# Patient Record
Sex: Female | Born: 1945 | Race: White | Hispanic: No | Marital: Married | Smoking: Never smoker
Health system: Southern US, Community
[De-identification: ages and names within clinical notes are randomized; demographics above are authoritative.]

## PROBLEM LIST (undated history)

## (undated) DIAGNOSIS — M419 Scoliosis, unspecified: Secondary | ICD-10-CM

## (undated) DIAGNOSIS — F419 Anxiety disorder, unspecified: Secondary | ICD-10-CM

## (undated) DIAGNOSIS — M961 Postlaminectomy syndrome, not elsewhere classified: Secondary | ICD-10-CM

## (undated) DIAGNOSIS — I10 Essential (primary) hypertension: Secondary | ICD-10-CM

## (undated) DIAGNOSIS — E785 Hyperlipidemia, unspecified: Secondary | ICD-10-CM

## (undated) DIAGNOSIS — Z9289 Personal history of other medical treatment: Secondary | ICD-10-CM

## (undated) HISTORY — DX: Hyperlipidemia, unspecified: E78.5

## (undated) HISTORY — DX: Scoliosis, unspecified: M41.9

## (undated) HISTORY — PX: COLONOSCOPY: SHX174

## (undated) HISTORY — DX: Postlaminectomy syndrome, not elsewhere classified: M96.1

## (undated) HISTORY — DX: Anxiety disorder, unspecified: F41.9

## (undated) HISTORY — DX: Essential (primary) hypertension: I10

## (undated) HISTORY — DX: Personal history of other medical treatment: Z92.89

---

## 1997-07-14 ENCOUNTER — Other Ambulatory Visit: Admission: RE | Admit: 1997-07-14 | Discharge: 1997-07-14 | Payer: Self-pay | Admitting: Obstetrics and Gynecology

## 1998-04-08 ENCOUNTER — Other Ambulatory Visit: Admission: RE | Admit: 1998-04-08 | Discharge: 1998-04-08 | Payer: Self-pay | Admitting: Obstetrics and Gynecology

## 1998-05-18 ENCOUNTER — Encounter: Payer: Self-pay | Admitting: Obstetrics and Gynecology

## 1998-05-18 ENCOUNTER — Ambulatory Visit (HOSPITAL_COMMUNITY): Admission: RE | Admit: 1998-05-18 | Discharge: 1998-05-18 | Payer: Self-pay | Admitting: Obstetrics and Gynecology

## 1999-07-12 ENCOUNTER — Other Ambulatory Visit: Admission: RE | Admit: 1999-07-12 | Discharge: 1999-07-12 | Payer: Self-pay | Admitting: Obstetrics and Gynecology

## 2000-09-12 ENCOUNTER — Other Ambulatory Visit: Admission: RE | Admit: 2000-09-12 | Discharge: 2000-09-12 | Payer: Self-pay | Admitting: Obstetrics and Gynecology

## 2001-12-09 ENCOUNTER — Other Ambulatory Visit: Admission: RE | Admit: 2001-12-09 | Discharge: 2001-12-09 | Payer: Self-pay | Admitting: Obstetrics and Gynecology

## 2003-05-04 ENCOUNTER — Other Ambulatory Visit: Admission: RE | Admit: 2003-05-04 | Discharge: 2003-05-04 | Payer: Self-pay | Admitting: Obstetrics and Gynecology

## 2005-02-19 HISTORY — PX: BACK SURGERY: SHX140

## 2005-02-19 HISTORY — PX: DIAPHRAGMATIC HERNIA REPAIR: SUR1167

## 2005-10-30 ENCOUNTER — Other Ambulatory Visit: Admission: RE | Admit: 2005-10-30 | Discharge: 2005-10-30 | Payer: Self-pay | Admitting: Obstetrics and Gynecology

## 2005-11-12 ENCOUNTER — Inpatient Hospital Stay (HOSPITAL_COMMUNITY): Admission: RE | Admit: 2005-11-12 | Discharge: 2005-11-15 | Payer: Self-pay | Admitting: Neurosurgery

## 2006-07-10 ENCOUNTER — Encounter: Admission: RE | Admit: 2006-07-10 | Discharge: 2006-07-10 | Payer: Self-pay | Admitting: Neurosurgery

## 2006-09-02 ENCOUNTER — Inpatient Hospital Stay (HOSPITAL_COMMUNITY): Admission: RE | Admit: 2006-09-02 | Discharge: 2006-09-02 | Payer: Self-pay | Admitting: Neurosurgery

## 2006-11-27 ENCOUNTER — Ambulatory Visit: Payer: Self-pay | Admitting: Thoracic Surgery

## 2007-01-06 ENCOUNTER — Encounter (INDEPENDENT_AMBULATORY_CARE_PROVIDER_SITE_OTHER): Payer: Self-pay | Admitting: General Surgery

## 2007-01-06 ENCOUNTER — Ambulatory Visit: Payer: Self-pay | Admitting: Thoracic Surgery

## 2007-01-06 ENCOUNTER — Inpatient Hospital Stay (HOSPITAL_COMMUNITY): Admission: RE | Admit: 2007-01-06 | Discharge: 2007-01-09 | Payer: Self-pay | Admitting: Thoracic Surgery

## 2007-08-12 ENCOUNTER — Encounter: Admission: RE | Admit: 2007-08-12 | Discharge: 2007-08-12 | Payer: Self-pay | Admitting: Neurosurgery

## 2010-07-04 NOTE — Op Note (Signed)
Deborah Sellers, Deborah Sellers              ACCOUNT NO.:  [id number]   MEDICAL RECORD NO.:  [id number]          PATIENT TYPE:  INP   LOCATION:  2550                         FACILITY:  [facility]   PHYSICIAN:  Adolph Pollack, M.D.DATE OF BIRTH:  01-06-1946   DATE OF PROCEDURE:  01/06/2007  DATE OF DISCHARGE:                               OPERATIVE REPORT   PREOPERATIVE DIAGNOSIS:  Right diaphragmatic hernia (of).   POSTOPERATIVE DIAGNOSIS:  Right diaphragmatic hernia (of ).   PROCEDURE:  Laparoscopic repair of diaphragmatic hernia with PROCEED  mesh.   SURGEON:  Adolph Pollack, M.D.   ASSISTANT:  Ines Bloomer, M.D.   ANESTHESIA:  General.   INDICATIONS:  Mrs. Strahan is a 65 year old female who underwent a spinal  fusion and she had right cardiophrenic mass noted.  She underwent a  second operation approximately 9-10 months later and the preop chest x-  ray demonstrated increased size of this mass.  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  She was seen by Dr. Edwyna Shell as most of these in the past have  been repaired by a thoracic approach, although recently some had been  repaired laparoscopically.  She now presents for laparoscopic, possible  thoracoscopic-assisted hernia repair.  The procedure and the risks were  discussed with her preoperatively.   TECHNIQUE:  She is seen in the holding area and brought to the operating  room, placed supine on the operating table and general anesthetic was  administered.  She was then placed in the at 45-degree position with  areas padded appropriately.  A double lumen tube had been used for  intubation.  The right chest, anterior and posterior,  and abdominal  wall and right flank was sterilely prepped and draped.   She was turned to the supine position and a small subumbilical incision  was made through the  skin, subcutaneous tissue, fascia and peritoneum  entering the peritoneal cavity under direct vision.  A pursestring  suture of 0 Vicryl was placed around the fascial edges.  A Hassan trocar  was introduced into the peritoneal cavity and pneumoperitoneum created  by insufflation of CO2 gas.   Next, a laparoscope was introduced and the hernia contents and the  omentum going up into the right chest to the diaphragmatic defect was  noted.  A 5 mm trocar was then placed in the left lateral, mid lateral  abdomen and one in the right mid lateral abdomen.  Using careful manual  traction,  the hernia was reduced of the contents of the right part of  the colon and the omentum appeared to be around the hepatic flexure  where the colon was reduced.  It was adherent to the sac and this  adherent area was divided with the Harmonic scalpel, completely reducing  the hernia contents back into the abdominal cavity.  No injury to the  colon was noted.   Following this, I excised the sac using harmonic scalpel until we could  see the  central tendon of the diaphragm well.  We were then able to  measure the defect and measured approximately 8 cm wide x 4 to 5 cm in  the longitudinal fashion.  A piece of PROCEED mesh was then brought into  the field and cut.  I was I was able to the spinal needles to give me  three anchoring sutures in the 12 o'clock, 3 o'clock and 9 o'clock  positions.  The mesh was cut to an appropriate size to allow for 3-4 cm  of overlap in all directions.  Anchoring sutures of  #1 Novofil were  then put in the 12 o'clock, 3 o'clock and 9 o'clock positions.  The mesh  was then placed into the abdominal cavity with the blue side facing up.  Stab incisions were performed in the 12 o'clock, 3 o'clock and 9 o'clock  positions around the defect and then the sutures were pulled up and tied  down across the fascial bridge, initially anchoring up the superior  aspect of mesh.  I then used spiral  tackers to further anchor the  superior aspect of the mesh and then also anchored the mesh were  circumferentially and with an outer rim and inner rim.  This provided  more than adequate coverage with a probe with adequate overlap of the  defect.  I then inspected the repair and no bleeding was noted.  The  viscera were again expect inspected and no injury or bleeding was noted.  There was minimal blood loss.   Following this,  I released the pneumoperitoneum and watched the liver  and part of the omentum approximate the mesh.  The trocars were then  removed.  The subumbilical fascial defect was closed by tightening up  and tying down the pursestring suture.   Skin incisions were closed with 4-0 Monocryl subcuticular stitches  followed by Steri-Strips and sterile dressings.  She tolerated the  procedure without apparent complications and was taken to recovery in  satisfactory condition.      Adolph Pollack, M.D.  Electronically Signed     TJR/MEDQ  D:  01/06/2007  T:  01/06/2007  Job:  [id number]   cc:   Ines Bloomer, M.D.  Barry Dienes Eloise Harman, M.D.

## 2010-07-04 NOTE — Op Note (Signed)
NAME:  Deborah Sellers, Deborah Sellers              ACCOUNT NO.:  [id number]   MEDICAL RECORD NO.:  [id number]          PATIENT TYPE:  INP   LOCATION:  NA                           FACILITY:  [facility]   PHYSICIAN:  Henry A. Pool, M.D.    DATE OF BIRTH:  Sep 09, 1945   DATE OF PROCEDURE:  09/02/2006  DATE OF DISCHARGE:                               OPERATIVE REPORT   PREOPERATIVE DIAGNOSIS:  Prominent right-sided T12 and L1 hardware  status post thoracolumbar decompression and fusion with instrumentation.   POSTOPERATIVE DIAGNOSIS:  Prominent right-sided T12 and L1 hardware  status post thoracolumbar decompression and fusion with instrumentation.   PROCEDURE NOTE:  Re-exploration of fusion with removal of T12 and L1  hardware on the right.   SURGEON:  Kathaleen Maser. Pool, M.D.   ASSISTANT:  Donalee Citrin, M.D.   ANESTHESIA:  General orotracheal anesthesia.   INDICATIONS FOR PROCEDURE:  [name] is a 65 year old female, status  post previous thoracolumbar decompression and fusion with  instrumentation for treatment of her idiopathic scoliosis.  Postoperatively the patient has done well with great improvement of her  overall pain syndrome, however, she has been bothered by some prominent  hardware on the right side at the superior aspect of her construct.  This caused her discomfort while sitting in a chair.  Follow-up studies  revealed stable appearance of the fusion with what appears to be intact  bony union throughout.  I discussed situation with the patient.  We have  decided to remove the upper aspect of the construct on the right side in  hopes of improving her pain level.  Patient is aware of the risks and  benefits involved with surgery and wishes to proceed.   DESCRIPTION OF PROCEDURE:  Patient taken to the operating room and  placed on the table in supine position.  After adequate level of  anesthesia was achieved, the patient positioned prone onto Wilson frame,  appropriately padded the patient  lumbar region and lower thoracic region  prepped draped sterilely.  A 10-blade was used to make a linear skin  incision.  This was carried down sharply in the midline.  The pedicle  screw instrumentation on the right-side at T12 and L1 were dissected  free.  The transverse connector between T12 and L1 was dissected free  and then removed.  The screw heads on the right at T12 and L1 were then  dissected free and the locking caps were removed.  The rod was then cut  with a titanium cutting drill bit.  The rod section from above L2 was  then removed.  Pedicle screws at L1 and T12 were then removed.  The  fusion was explored and found to be solid from T12 distally.  Wound was  then copiously irrigated with antibiotic solution.  Hemostasis was  achieved with a Bovie electrocautery.  Wound was then closed in layers with Vicryl sutures.  Steri-Strips and  sterile dressing were applied.  There were no apparent complications.  The patient tolerated the procedure well and she returns to the recovery  room postoperatively.  ______________________________  Kathaleen Maser Pool, M.D.     HAP/MEDQ  D:  09/02/2006  T:  09/02/2006  Job:  [id number]

## 2010-07-04 NOTE — H&P (Signed)
Deborah Sellers, Deborah Sellers              ACCOUNT NO.:  [id number]   MEDICAL RECORD NO.:  [id number]          PATIENT TYPE:  INP   LOCATION:  NA                           FACILITY:  [facility]   PHYSICIAN:  Ines Bloomer, M.D. DATE OF BIRTH:  [birth year]   DATE OF ADMISSION:  DATE OF DISCHARGE:                              HISTORY & PHYSICAL   CHIEF COMPLAINT:  Diaphragmatic hernia.   HISTORY OF PRESENT ILLNESS:  This 65 year old patient had surgery for  scoliosis recently with a spinal fusion September of 2007.  At that  time, a chest x-ray showed a small right cardiophrenic mass that was  thought to be a fat pad.  She had hardware removal in July of 2008 and  chest x-ray showed increased size of the right cardiophrenic mass.  CT  scan showed an anterior right-sided diaphragmatic defect with colon and  omentum indicative of a foramen of Morgagni hernia.  She has had no  dysphagia, bloating, abdominal pain or dyspnea.  She has some fatigue  and no constipation.  No history of bleeding.  She is referred here for  evaluation.   PAST MEDICAL HISTORY:  Significant for:  1. Hypertension.  2. Spinal scoliosis.   PAST SURGICAL HISTORY:  Spinal fusion and hardware removal.   ALLERGIES:  SHE HAS NO ALLERGIES.   MEDICATIONS:  She is on Diovan/hydrochlorothiazide 160 mg/12.5 a day.   SOCIAL HISTORY:  She is married and works as a Oncologist.  She does not drink alcohol.  She drinks 2 to 3  glasses of wine per week.   FAMILY HISTORY:  Noncontributory.   REVIEW OF SYSTEMS:  She is 125 pounds, she is 5 feet 2 inches.  Weight  has been stable.  CARDIAC:  No anterior defibrillation.  PULMONARY:  See  history of present illness.  No hemoptysis, bronchitis or asthma.  GI:  No GERD.  No nausea, vomiting, constipation or diarrhea.  GU:  No kidney  disease, dysuria or frequent urination.  VASCULAR:  No claudication, DVT  or TIAs.  NEUROLOGICAL:  No headaches, blackouts or  seizures.  MUSCULOSKELETAL:  No arthritis or joint pain.  PSYCHIATRIC:  No  psychiatric.  HEENT:  No change in her eyesight or hearing.  HEMATOLOGICAL:  No problems bleeding or clotting disorders.   PHYSICAL EXAMINATION:  VITAL SIGNS:  Her blood pressure is 180/100.  Pulse 90.  Respirations 18.  Saturations are 98%.  HEENT:  Head is atraumatic.  Eyes:  Pupils are equal and reactive to  light and accommodation.  Extraocular movements are normal.  Ears:  Tympanic membranes are intact.  Nose:  There is no septal deviation.  Throat is without lesion.  NECK:  Supple without thyromegaly.  There is no supraclavicular or  axillary adenopathy.  CHEST:  Clear to auscultation and percussion.  HEART:  Regular sinus rhythm.  No murmurs.  ABDOMEN:  Soft.  There is no abdominal tenderness.  No  hepatosplenomegaly.  Bowel sounds are normal.  EXTREMITIES:  Pulses are 2+.  There is no clubbing or edema.  BACK:  There is  spinal incisions.  NEUROLOGICAL:  She is oriented x3.  Sensory and motor intact.  Cranial  nerves intact.  SKIN:  Without lesion.   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  2. Scoliosis, status post surgery.  3. Hypertension.   PLAN:  Repair of foramen of Morgagni hernia via the abdominal and  possibly chest approach with Dr. Avel Peace.      Ines Bloomer, M.D.  Electronically Signed     DPB/MEDQ  D:  01/02/2007  T:  01/02/2007  Job:  [id number]

## 2010-07-07 NOTE — Letter (Signed)
November 29, 2006   Adolph Pollack, M.D.  [address], [address]  [place], [place] [address]   Re:  Deborah, Sellers                DOB:  08/09/1945   Dear Tawanna Cooler:   I appreciate the opportunity seeing Deborah Sellers.  This 65 year old  patient apparently had back surgery for scoliosis, and at the time of  her surgery, she had scoliosis with spinal fusion, which was done on  November 12, 2005.  A chest x-ray at that time showed a small right  cardiophrenic structure that was thought to be a fat pad.  She returned  for hardware removal in July 2008, and chest x-ray showed increased size  in the right cardiophrenic mass.  A CT scan was done at [facility],  which revealed an anterior right-sided diaphragmatic defect with colon  and omentum that had increased in size.  The anterior location goes  along with her right foramen Morgagni hernia.  She has had no dysphagia,  bloating, abdominal pain.  No dyspnea.  Has some fatigue.  No problems  with constipation.   PAST MEDICAL HISTORY:  She has hypertension as well as the spinal  scoliosis.  The only operation was the spinal fusion with  instrumentation, and then removal of some of the hardware.   SHE HAS NO ALLERGIES.   MEDICATIONS:  Include Diovan hydrochlorothiazide 160 mg daily.   SOCIAL HISTORY:  She is married.  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  No history of tobacco abuse.  She has 2 to 3  glasses of wine per week.   FAMILY HISTORY:  Noncontributory.   REVIEW OF SYSTEMS:  Weight is 125 pounds.  She is 5 feet 2 inches.  Her  weight has been stable.  CARDIAC:  No angina or atrial fibrillation.  PULMONARY:  See history of present illness.  No bronchitis, hemoptysis  or asthma.  GI:  See history of present illness.  No history of GERD.  GU:  No kidney disease, dysuria, or frequent urination.  VASCULAR:  No claudication, DVT, TIA.  NEUROLOGIC:  No headaches, blackouts, or seizures.  MUSCULOSKELETAL:  No arthritis or joint pain.  PSYCHIATRIC:  No depression or nervousness.  HEENT:  No change in her eyesight or hearing.  HOMOLOGICAL:  No problems with bleeding or clotting disorders.   PHYSICAL EXAMINATION:  Her blood pressure was 181/100.  Pulse 89.  Respirations 18.  Saturation 98%.  Head, eyes, ears, nose, and throat  are unremarkable.  Neck is supple without thyromegaly.  Chest is clear  to auscultation and percussion.  Heart regular sinus rhythm.  Abdomen is  soft.  There is no hepatosplenomegaly.  Pulses are 2+.  No clubbing or  edema.  Back:  She has had spinal incisions.  Neurologic:  She is  oriented x3.  Sensory and motor intact.  Cranial nerves are intact.   I feel that she has a large foramen Morgagni hernia with increasing size  of her herniation of her right colon and omentum.  She needs a repair of  the lesion from a thoracic approach.  The only problem would be is  whether we can reduce this easily from the chest, and may require  abdominal approach in order to be sure that the colon is adequately  reduced.  We will try to do this with a VATS approach in order to  minimize the chest spreading, rib  spreading, and incisions, but she will  probably need a Gortex patch to repair the defect.  I will discuss this  with you, as we may need to do a combined approach in order to make sure  that the colon is adequately reduced.   I appreciate the opportunity in seeing this very interesting patient.   Ines Bloomer, M.D.  Electronically Signed   DPB/MEDQ  D:  11/29/2006  T:  11/30/2006  Job:  [id number]   cc:   Barry Dienes. Eloise Harman, M.D.

## 2010-07-07 NOTE — Discharge Summary (Signed)
Deborah Sellers, Deborah Sellers              ACCOUNT NO.:  [id number]   MEDICAL RECORD NO.:  [id number]          PATIENT TYPE:  INP   LOCATION:  5727                         FACILITY:  [facility]   PHYSICIAN:  Adolph Pollack, M.D.DATE OF BIRTH:  [birth year]   DATE OF ADMISSION:  01/06/2007  DATE OF DISCHARGE:  01/09/2007                               DISCHARGE SUMMARY   FINAL DISCHARGE DIAGNOSIS:  Diaphragmatic hernia (morgagni type).   SECONDARY DIAGNOSIS:  1. Hypertension.  2. Spinal scoliosis.  3. Postoperative atelectasis.   PROCEDURES:  Laparoscopic repair of diaphragmatic hernia with mesh.   REASON FOR ADMISSION:  [name] is a 65 year old female, incidentally  noted to have a right cardiophrenic mass that increased in size.  A CT  scan demonstrated a right-sided diaphragmatic defect with colon and  omentum up in it, and she was admitted.   HOSPITAL COURSE:  She was admitted and the plan was initially for a  laparoscopic, possible thoracoscopic repair.  However, by way of  laparoscopy, the repair was able to be performed.  Postoperatively, she  was in the step-down unit and slowly improved.  She had some significant  atelectasis which led to fever, as well as some desaturation; but with  oxygen and increasing her oximetry, this resolved.  Her diet was able to  be advanced.  Her chest x-ray on November 20th showed improved aeration  with some residual hernia sac.  It was felt that she was improved enough  to be able to be discharged.   DISPOSITION:  Discharged to home on January 09, 2007.  She was given  specific discharge instructions including activity restrictions.  She is  told to maintain a soft diet.  She will follow up with me in 2 or 3  weeks.      Adolph Pollack, M.D.  Electronically Signed     TJR/MEDQ  D:  02/09/2007  T:  02/10/2007  Job:  [id number]   cc:   Ines Bloomer, M.D.  Barry Dienes Eloise Harman, M.D.  Altamease Oiler, MD

## 2010-07-07 NOTE — Discharge Summary (Signed)
NAME:  ZOELLE, MARKUS              ACCOUNT NO.:  [id number]   MEDICAL RECORD NO.:  [id number]          PATIENT TYPE:  INP   LOCATION:  3010                         FACILITY:  [facility]   PHYSICIAN:  Sherilyn Cooter A. Pool, M.D.    DATE OF BIRTH:  [birth year]   DATE OF ADMISSION:  11/12/2005  DATE OF DISCHARGE:  11/15/2005                                 DISCHARGE SUMMARY   SERVICE:  Neurosurgery.   FINAL DIAGNOSIS:  Idiopathic thoracolumbar scoliosis with intractable pain.   OPERATION AND TREATMENTS:  T12-L5 decompressive laminectomy with fusion and  instrumentation.   HOSPITAL COURSE:  Ms. Donlon was admitted for multilevel thoracolumbar  decompression and fusion with instrumentation. Post surgery was uneventful.  Postoperatively the patient awakened with the normal amount of incisional  pain but no radicular deficits.  She was gradually mobilized with physical  and occupational therapy with good results.  She had some transient  hypotension which resolved.   CONDITION ON DISCHARGE:  Improving.   DISCHARGE DISPOSITION:  The patient will follow up in 1 week in my office.           ______________________________  Kathaleen Maser. Pool, M.D.     HAP/MEDQ  D:  12/12/2005  T:  12/13/2005  Job:  [id number]

## 2010-07-07 NOTE — Op Note (Signed)
Deborah Sellers, Deborah Sellers              ACCOUNT NO.:  [id number]   MEDICAL RECORD NO.:  [id number]          PATIENT TYPE:  INP   LOCATION:  2899                         FACILITY:  [facility]   PHYSICIAN:  Sherilyn Cooter A. Pool, M.D.    DATE OF BIRTH:  26-Apr-1945   DATE OF PROCEDURE:  11/12/2005  DATE OF DISCHARGE:                                 OPERATIVE REPORT   PREOPERATIVE DIAGNOSES:  1. Idiopathic thoracolumbar scoliosis with progressive disk degeneration      and decompensation.  2.  Multilevel stenosis.   POSTOPERATIVE DIAGNOSES:  1. Idiopathic thoracolumbar scoliosis with progressive disk degeneration      and decompensation.  2.  Multilevel stenosis.   PROCEDURE NAME:  1. T12 through L5 decompressive laminectomy with foraminotomies.      Decompression was more than would be required for simple interbody      fusion alone.  2. L1-2, L2-3, L3-4, L4-5 posterior lumbar interbody fusion utilizing      Tangent interbody allograft wedges and Telamoninterbody Peek cage with      local autografting.  3. T12 through L5 posterolateral arthrodesis utilizing segmental pedicle      screw fixation and local autografting.   SURGEON:  Kathaleen Maser. Pool, M.D.   ASSISTANT:  Donalee Citrin, M.D.   ANESTHESIA:  General orotracheal.   INDICATIONS:  [name] is a 65 year old female with a history of severe  lumbar pain, failing all conservative management.  The patient also has some  radicular component, left greater than right.  Workup demonstrates evidence  of an idiopathic thoracolumbar curve predominantly involving her lumbar  spine with decompensation in her mid lumbar region with a severe convex  curve to her left.  The patient is miserable with her pain.  She essentially  has become nonfunctional because of it.  We have discussed options for  management including the possibility of undergoing thoracolumbar  decompression and fusion with instrumentation in hopes of improving her  symptoms.   OPERATIVE  NOTE:  The patient was taken to the operating room, placed on the  Wilson in supine position.  After adequate level of anesthesia was achieved,  the patient was positioned prone onto the Wilson frame, appropriately  padded. The patient's lumbar region was prepped and draped sterilely.  A  midline, 7-cm skin incision extending from T10 down to L5.  This was carried  down sharply in the midline.  Subperiosteal dissection was then performed  exposing the lamina and facet joints of T11, T12, L1, L2, L3, L4, and L5.  Deep self-retaining retractors were placed.  Intraoperative fluoroscopy was  used and levels were confirmed.  Decompressive laminectomies were then  performed using the high-speed drill and Kerrison rongeurs, and the Leksell  rongeur to completely remove the lamina of T12, L1, L2, L3,L4 and then the  superior aspect of L5.  Inferior facetectomies were performed at L1, L2, L3,  L4 bilaterally.  Superior facetectomy was performed at L2, L3, L4, and L5  bilaterally.  All bone was cleaned and used later as autograft.  Ligament  flavum was elevated and resected from deeper fascia  using Kerrison rongeurs.  After very aggressive foraminotomies were performed along the course of the  exiting L1, L2, L3, L4, and L5 nerve roots, attention then placed toward the  interbody fusion.   Starting first at L4-5, the epidural venous plexus was coagulated and cut.  The thecal sac and nerve roots were protected starting first on the  patient's left side.  Disk space was incised with the 15 blade, and  retracted . A wide disk space cleaning  was then achieved using pituitary  rongeurs, open-angle pituitary rongeurs and Epstein curettes.  The procedure  then repeated at L4-5 on the left side and then bilaterally at L3-4, L2-3,  and L1-2.  Returning to L4-5, the disk space was distracted up to 11 mm with  an 11-mm distractor left in the patient's left side.  The thecal sac and  nerve roots were protected  on the right side.  Disk space was then reamed  and then cut with 10-mm Tangent instruments.  Soft tissues were removed from  the interspace.  A 10 x 26-mm Tangent wedge was then impacted into place,  recessed approximately 2 mm from the posterior cortical margin.  The  distractor was removed from the patient's left side.  The thecal sac and  nerve roots were protected on the left side.  Disk space was once again  reamed and then cut with 10-mm Tangent instruments.  Soft tissues removed  from the interspace.  Disk space was further curettaged using bone curettes.  Morselized autograft was impacted in the interspace.  A 10 x 26-mm Telamon  cage was then impacted into place recessed for approximately 2 mm from the  posterior cortical margin at L4-5.  The procedure was then repeated in a  very similar fashion at L3-4.  At L2-3 and L1-L2, 8 x 22-mm implants were  used bilaterally as well as a local autografting.  The pedicles of T12, L1,  L2, L3, L4, L5 were then identified using superficial landmarks and  intraoperative fluoroscopy.  The superficial bone overlying the pedicles was  then removed using the high-speed drill.  Each pedicle was then probed using  the pedicle awl.  Each pedicle was then tapped using a screw tap.  Each  screw tap hole was probed and found to be solidly within bone.  Then 5.75 x  45-mm screws were placed bilaterally at T12, 5.75 x 40-mm screws placed on  the left at L1 and a 45 screw was placed on the right at L1.  A 5.75 x 40-mm  screw was placed bilaterally at L2 and L3 and L4.  A 6.75 x 35-mm screw was  placed bilaterally at L5.  The transverse processes were then decorticated  using the high-speed drill.  Morselized autograft was impacted  posterolaterally for later fusion.  Segment of titanium rod was then cut and  contoured and placed over the screw heads at T12 through L5.  Locking caps were placed over the screw heads.  Locking caps were then engaged with the   construct under compression.  Good reduction of the patient's deformity had  been achieved.  The transverse connectors were placed.  Gelfoam was placed  topically and hemostasis was found to be good.  A medium Hemovac drain was  left in the epidural space.  The wound was then closed in layers of Vicryl  sutures.  Steri-Strips and sterile dressing were applied.  There were no  apparent complications.  The patient tolerated the procedure well and  she  returns to the recovery room for postoperative care.           ______________________________  Kathaleen Maser. Pool, M.D.     HAP/MEDQ  D:  11/12/2005  T:  11/14/2005  Job:  [id number]

## 2010-11-28 LAB — COMPREHENSIVE METABOLIC PANEL
ALT: 23
AST: 29
Alkaline Phosphatase: 74
CO2: 23
Calcium: 9
GFR calc Af Amer: >60
GFR calc non Af Amer: >60
Potassium: 3.6

## 2010-11-28 LAB — TYPE AND SCREEN
Antibody Screen: POSITIVE
DAT, IgG: NEGATIVE

## 2010-11-28 LAB — BLOOD GAS, ARTERIAL
Acid-base deficit: 1
Bicarbonate: 23
TCO2: 24.1
pCO2 arterial: 36.9
pH, Arterial: 7.411 — ABNORMAL HIGH

## 2010-11-28 LAB — URINALYSIS, ROUTINE W REFLEX MICROSCOPIC
Bilirubin Urine: NEGATIVE
Nitrite: NEGATIVE
Specific Gravity, Urine: 1.014
Urobilinogen, UA: 0.2

## 2010-11-28 LAB — APTT: aPTT: 28

## 2010-11-28 LAB — POCT I-STAT 4, (NA,K, GLUC, HGB,HCT)
HCT: 41
Operator id: 181601

## 2010-11-28 LAB — CBC
HCT: 40.1
MCHC: 33.7
MCV: 94.5

## 2010-11-28 LAB — URINE MICROSCOPIC-ADD ON

## 2010-12-05 LAB — DIFFERENTIAL
Basophils Absolute: 0
Basophils Relative: 0
Lymphocytes Relative: 33
Lymphs Abs: 2.2
Monocytes Absolute: 0.6
Monocytes Relative: 8

## 2010-12-05 LAB — BASIC METABOLIC PANEL
BUN: 14
CO2: 28
Calcium: 9.7
Chloride: 99
Creatinine, Ser: 0.6
GFR calc Af Amer: >60
Glucose, Bld: 105 — ABNORMAL HIGH
Sodium: 137

## 2010-12-05 LAB — CBC
HCT: 36.6
MCV: 91.4
Platelets: 312
WBC: 6.7

## 2011-05-16 ENCOUNTER — Ambulatory Visit: Payer: Self-pay | Admitting: Obstetrics and Gynecology

## 2012-11-19 ENCOUNTER — Encounter: Payer: Self-pay | Admitting: Internal Medicine

## 2012-11-20 ENCOUNTER — Ambulatory Visit (AMBULATORY_SURGERY_CENTER): Payer: Self-pay | Admitting: *Deleted

## 2012-11-20 VITALS — Ht 60.0 in | Wt 135.0 lb

## 2012-11-20 DIAGNOSIS — Z1211 Encounter for screening for malignant neoplasm of colon: Secondary | ICD-10-CM

## 2012-11-20 MED ORDER — MOVIPREP 100 G PO SOLR: 1.0000 | Freq: Once | ORAL | Status: DC

## 2012-11-20 NOTE — Progress Notes (Signed)
Denies allergies to eggs or soy products. Denies complications with sedation or anesthesia. 

## 2012-11-21 ENCOUNTER — Encounter: Payer: Self-pay | Admitting: Internal Medicine

## 2012-11-25 ENCOUNTER — Ambulatory Visit (AMBULATORY_SURGERY_CENTER): Payer: BC Managed Care – PPO | Admitting: Internal Medicine

## 2012-11-25 ENCOUNTER — Encounter: Payer: Self-pay | Admitting: Internal Medicine

## 2012-11-25 VITALS — BP 144/84 | HR 64 | Temp 98.6°F | Resp 18 | Ht 60.0 in | Wt 135.0 lb

## 2012-11-25 DIAGNOSIS — Z1211 Encounter for screening for malignant neoplasm of colon: Secondary | ICD-10-CM

## 2012-11-25 MED ORDER — SODIUM CHLORIDE 0.9 % IV SOLN: 500.0000 mL | INTRAVENOUS | Status: DC

## 2012-11-25 NOTE — Progress Notes (Signed)
Patient did not have preoperative order for IV antibiotic SSI prophylaxis. (G8918)  Patient did not experience any of the following events: a burn prior to discharge; a fall within the facility; wrong site/side/patient/procedure/implant event; or a hospital transfer or hospital admission upon discharge from the facility. (G8907)  

## 2012-11-25 NOTE — Op Note (Signed)
[facility] [address] [place], [address]   COLONOSCOPY PROCEDURE REPORT  PATIENT: Deborah, Sellers  MR#: [id number] BIRTHDATE: September 02, 1945 , 66  yrs. old GENDER: Female ENDOSCOPIST: Beverley Fiedler, MD REFERRED NF:[id number] Eloise Harman, M.D. PROCEDURE DATE:  11/25/2012 PROCEDURE:   Colonoscopy, screening First Screening Colonoscopy - Avg.  risk and is 50 yrs.  old or older Yes.  Prior Negative Screening - Now for repeat screening. N/A  History of Adenoma - Now for follow-up colonoscopy & has been > or = to 3 yrs.  N/A  Polyps Removed Today? No.  Recommend repeat exam, <10 yrs? No. ASA CLASS:   Class II INDICATIONS:average risk screening and first colonoscopy. MEDICATIONS: MAC sedation, administered by CRNA, Propofol (Diprivan), and propofol (Diprivan) 350mg  IV  DESCRIPTION OF PROCEDURE:   After the risks benefits and alternatives of the procedure were thoroughly explained, informed consent was obtained.  A digital rectal exam revealed no rectal mass.   The LB PFC-H190 [id number]  endoscope was introduced through the anus and advanced to the cecum, which was identified by both the appendix and ileocecal valve. No adverse events experienced. The quality of the prep was good, using MoviPrep  The instrument was then slowly withdrawn as the colon was fully examined.   COLON FINDINGS: There was severe diverticulosis noted throughout the entire examined colon (left greater than right colon) with associated angulation and tortuosity.  Manual pressure and supine positioning were necessary to reach the cecum.  The colon mucosa was otherwise normal.  Retroflexed views revealed internal hemorrhoids. The time to cecum=15 minutes 07 seconds.  Withdrawal time=8 minutes 08 seconds.  The scope was withdrawn and the procedure completed. COMPLICATIONS: There were no complications.  ENDOSCOPIC IMPRESSION: 1.   There was severe diverticulosis noted throughout the entire examined  colon 2.   The colon mucosa was otherwise normal  RECOMMENDATIONS: 1.  High fiber diet 2.  You should continue to follow colorectal cancer screening guidelines for "routine risk" patients with a repeat colonoscopy in 10 years.  There is no need for FOBT (stool) testing for at least 5 years.   eSigned:  Beverley Fiedler, MD 11/25/2012 9:25 AM   cc: The Patient and Jarome Matin, MD

## 2012-11-25 NOTE — Progress Notes (Signed)
Report to pacu RN, vss, bbs=clear 

## 2012-11-25 NOTE — Patient Instructions (Addendum)
YOU HAD AN ENDOSCOPIC PROCEDURE TODAY AT THE [facility]: Refer to the procedure report that was given to you for any specific questions about what was found during the examination.  If the procedure report does not answer your questions, please call your gastroenterologist to clarify.  If you requested that your care partner not be given the details of your procedure findings, then the procedure report has been included in a sealed envelope for you to review at your convenience later.  YOU SHOULD EXPECT: Some feelings of bloating in the abdomen. Passage of more gas than usual.  Walking can help get rid of the air that was put into your GI tract during the procedure and reduce the bloating. If you had a lower endoscopy (such as a colonoscopy or flexible sigmoidoscopy) you may notice spotting of blood in your stool or on the toilet paper. If you underwent a bowel prep for your procedure, then you may not have a normal bowel movement for a few days.  DIET: Your first meal following the procedure should be a light meal and then it is ok to progress to your normal diet.  A half-sandwich or bowl of soup is an example of a good first meal.  Heavy or fried foods are harder to digest and may make you feel nauseous or bloated.  Likewise meals heavy in dairy and vegetables can cause extra gas to form and this can also increase the bloating.  Drink plenty of fluids but you should avoid alcoholic beverages for 24 hours.  ACTIVITY: Your care partner should take you home directly after the procedure.  You should plan to take it easy, moving slowly for the rest of the day.  You can resume normal activity the day after the procedure however you should NOT DRIVE or use heavy machinery for 24 hours (because of the sedation medicines used during the test).    SYMPTOMS TO REPORT IMMEDIATELY: A gastroenterologist can be reached at any hour.  During normal business hours, 8:30 AM to 5:00 PM Monday through Friday,  call [phone].  After hours and on weekends, please call the GI answering service at [phone] who will take a message and have the physician on call contact you.   Following lower endoscopy (colonoscopy or flexible sigmoidoscopy):  Excessive amounts of blood in the stool  Significant tenderness or worsening of abdominal pains  Swelling of the abdomen that is new, acute  Fever of 100F or higher  FOLLOW UP: If any biopsies were taken you will be contacted by phone or by letter within the next 1-3 weeks.  Call your gastroenterologist if you have not heard about the biopsies in 3 weeks.  Our staff will call the home number listed on your records the next business day following your procedure to check on you and address any questions or concerns that you may have at that time regarding the information given to you following your procedure. This is a courtesy call and so if there is no answer at the home number and we have not heard from you through the emergency physician on call, we will assume that you have returned to your regular daily activities without incident.  SIGNATURES/CONFIDENTIALITY: You and/or your care partner have signed paperwork which will be entered into your electronic medical record.  These signatures attest to the fact that that the information above on your After Visit Summary has been reviewed and is understood.  Full responsibility of the confidentiality of this   discharge information lies with you and/or your care-partner.  Recommendations See procedure report  

## 2012-11-26 ENCOUNTER — Telehealth: Payer: Self-pay | Admitting: *Deleted

## 2012-11-26 NOTE — Telephone Encounter (Signed)
Message left

## 2013-04-15 DIAGNOSIS — M899 Disorder of bone, unspecified: Secondary | ICD-10-CM | POA: Diagnosis not present

## 2013-04-15 DIAGNOSIS — M949 Disorder of cartilage, unspecified: Secondary | ICD-10-CM | POA: Diagnosis not present

## 2013-04-15 DIAGNOSIS — M4120 Other idiopathic scoliosis, site unspecified: Secondary | ICD-10-CM | POA: Diagnosis not present

## 2013-06-15 DIAGNOSIS — Z79899 Other long term (current) drug therapy: Secondary | ICD-10-CM | POA: Diagnosis not present

## 2013-06-15 DIAGNOSIS — Z5181 Encounter for therapeutic drug level monitoring: Secondary | ICD-10-CM | POA: Diagnosis not present

## 2013-06-23 DIAGNOSIS — M8448XA Pathological fracture, other site, initial encounter for fracture: Secondary | ICD-10-CM | POA: Diagnosis not present

## 2013-06-23 DIAGNOSIS — M4120 Other idiopathic scoliosis, site unspecified: Secondary | ICD-10-CM | POA: Diagnosis not present

## 2013-07-14 DIAGNOSIS — M961 Postlaminectomy syndrome, not elsewhere classified: Secondary | ICD-10-CM | POA: Diagnosis not present

## 2013-07-14 DIAGNOSIS — M545 Low back pain, unspecified: Secondary | ICD-10-CM | POA: Diagnosis not present

## 2013-07-14 DIAGNOSIS — M47817 Spondylosis without myelopathy or radiculopathy, lumbosacral region: Secondary | ICD-10-CM | POA: Diagnosis not present

## 2013-07-14 DIAGNOSIS — G894 Chronic pain syndrome: Secondary | ICD-10-CM | POA: Diagnosis not present

## 2013-07-23 DIAGNOSIS — G894 Chronic pain syndrome: Secondary | ICD-10-CM | POA: Diagnosis not present

## 2013-07-23 DIAGNOSIS — M545 Low back pain, unspecified: Secondary | ICD-10-CM | POA: Diagnosis not present

## 2013-07-23 DIAGNOSIS — M961 Postlaminectomy syndrome, not elsewhere classified: Secondary | ICD-10-CM | POA: Diagnosis not present

## 2013-07-23 DIAGNOSIS — M47817 Spondylosis without myelopathy or radiculopathy, lumbosacral region: Secondary | ICD-10-CM | POA: Diagnosis not present

## 2013-08-10 DIAGNOSIS — M545 Low back pain, unspecified: Secondary | ICD-10-CM | POA: Diagnosis not present

## 2013-08-10 DIAGNOSIS — M961 Postlaminectomy syndrome, not elsewhere classified: Secondary | ICD-10-CM | POA: Diagnosis not present

## 2013-08-10 DIAGNOSIS — M47817 Spondylosis without myelopathy or radiculopathy, lumbosacral region: Secondary | ICD-10-CM | POA: Diagnosis not present

## 2013-08-10 DIAGNOSIS — G894 Chronic pain syndrome: Secondary | ICD-10-CM | POA: Diagnosis not present

## 2013-08-12 DIAGNOSIS — M4120 Other idiopathic scoliosis, site unspecified: Secondary | ICD-10-CM | POA: Diagnosis not present

## 2013-08-12 DIAGNOSIS — Z6825 Body mass index (BMI) 25.0-25.9, adult: Secondary | ICD-10-CM | POA: Diagnosis not present

## 2013-08-25 DIAGNOSIS — M47817 Spondylosis without myelopathy or radiculopathy, lumbosacral region: Secondary | ICD-10-CM | POA: Diagnosis not present

## 2013-08-25 DIAGNOSIS — M961 Postlaminectomy syndrome, not elsewhere classified: Secondary | ICD-10-CM | POA: Diagnosis not present

## 2013-08-25 DIAGNOSIS — G894 Chronic pain syndrome: Secondary | ICD-10-CM | POA: Diagnosis not present

## 2013-08-25 DIAGNOSIS — M545 Low back pain, unspecified: Secondary | ICD-10-CM | POA: Diagnosis not present

## 2013-09-08 DIAGNOSIS — M545 Low back pain, unspecified: Secondary | ICD-10-CM | POA: Diagnosis not present

## 2013-09-08 DIAGNOSIS — Z124 Encounter for screening for malignant neoplasm of cervix: Secondary | ICD-10-CM | POA: Diagnosis not present

## 2013-09-08 DIAGNOSIS — G894 Chronic pain syndrome: Secondary | ICD-10-CM | POA: Diagnosis not present

## 2013-09-08 DIAGNOSIS — M961 Postlaminectomy syndrome, not elsewhere classified: Secondary | ICD-10-CM | POA: Diagnosis not present

## 2013-09-08 DIAGNOSIS — M47817 Spondylosis without myelopathy or radiculopathy, lumbosacral region: Secondary | ICD-10-CM | POA: Diagnosis not present

## 2013-11-09 DIAGNOSIS — M545 Low back pain, unspecified: Secondary | ICD-10-CM | POA: Diagnosis not present

## 2013-11-09 DIAGNOSIS — G894 Chronic pain syndrome: Secondary | ICD-10-CM | POA: Diagnosis not present

## 2013-11-09 DIAGNOSIS — M47817 Spondylosis without myelopathy or radiculopathy, lumbosacral region: Secondary | ICD-10-CM | POA: Diagnosis not present

## 2013-11-09 DIAGNOSIS — M961 Postlaminectomy syndrome, not elsewhere classified: Secondary | ICD-10-CM | POA: Diagnosis not present

## 2013-11-23 DIAGNOSIS — M961 Postlaminectomy syndrome, not elsewhere classified: Secondary | ICD-10-CM | POA: Diagnosis not present

## 2013-11-23 DIAGNOSIS — M47818 Spondylosis without myelopathy or radiculopathy, sacral and sacrococcygeal region: Secondary | ICD-10-CM | POA: Diagnosis not present

## 2013-11-23 DIAGNOSIS — M545 Low back pain: Secondary | ICD-10-CM | POA: Diagnosis not present

## 2013-11-23 DIAGNOSIS — G894 Chronic pain syndrome: Secondary | ICD-10-CM | POA: Diagnosis not present

## 2013-12-08 DIAGNOSIS — M47817 Spondylosis without myelopathy or radiculopathy, lumbosacral region: Secondary | ICD-10-CM | POA: Diagnosis not present

## 2013-12-08 DIAGNOSIS — Z1231 Encounter for screening mammogram for malignant neoplasm of breast: Secondary | ICD-10-CM | POA: Diagnosis not present

## 2013-12-08 DIAGNOSIS — G894 Chronic pain syndrome: Secondary | ICD-10-CM | POA: Diagnosis not present

## 2013-12-08 DIAGNOSIS — M545 Low back pain: Secondary | ICD-10-CM | POA: Diagnosis not present

## 2013-12-08 DIAGNOSIS — M961 Postlaminectomy syndrome, not elsewhere classified: Secondary | ICD-10-CM | POA: Diagnosis not present

## 2013-12-24 DIAGNOSIS — I10 Essential (primary) hypertension: Secondary | ICD-10-CM | POA: Diagnosis not present

## 2013-12-24 DIAGNOSIS — J069 Acute upper respiratory infection, unspecified: Secondary | ICD-10-CM | POA: Diagnosis not present

## 2013-12-24 DIAGNOSIS — H6990 Unspecified Eustachian tube disorder, unspecified ear: Secondary | ICD-10-CM | POA: Diagnosis not present

## 2014-01-05 DIAGNOSIS — M47817 Spondylosis without myelopathy or radiculopathy, lumbosacral region: Secondary | ICD-10-CM | POA: Diagnosis not present

## 2014-01-05 DIAGNOSIS — M961 Postlaminectomy syndrome, not elsewhere classified: Secondary | ICD-10-CM | POA: Diagnosis not present

## 2014-01-05 DIAGNOSIS — M545 Low back pain: Secondary | ICD-10-CM | POA: Diagnosis not present

## 2014-01-05 DIAGNOSIS — G894 Chronic pain syndrome: Secondary | ICD-10-CM | POA: Diagnosis not present

## 2014-04-09 DIAGNOSIS — H35371 Puckering of macula, right eye: Secondary | ICD-10-CM | POA: Diagnosis not present

## 2014-04-09 DIAGNOSIS — H2511 Age-related nuclear cataract, right eye: Secondary | ICD-10-CM | POA: Diagnosis not present

## 2014-04-09 DIAGNOSIS — H35372 Puckering of macula, left eye: Secondary | ICD-10-CM | POA: Diagnosis not present

## 2014-04-09 DIAGNOSIS — H2512 Age-related nuclear cataract, left eye: Secondary | ICD-10-CM | POA: Diagnosis not present

## 2014-05-10 DIAGNOSIS — M5134 Other intervertebral disc degeneration, thoracic region: Secondary | ICD-10-CM | POA: Diagnosis not present

## 2014-05-10 DIAGNOSIS — M549 Dorsalgia, unspecified: Secondary | ICD-10-CM | POA: Diagnosis not present

## 2014-05-10 DIAGNOSIS — M5416 Radiculopathy, lumbar region: Secondary | ICD-10-CM | POA: Diagnosis not present

## 2014-05-10 DIAGNOSIS — M5137 Other intervertebral disc degeneration, lumbosacral region: Secondary | ICD-10-CM | POA: Diagnosis not present

## 2014-05-10 DIAGNOSIS — M4120 Other idiopathic scoliosis, site unspecified: Secondary | ICD-10-CM | POA: Diagnosis not present

## 2014-05-10 DIAGNOSIS — M546 Pain in thoracic spine: Secondary | ICD-10-CM | POA: Diagnosis not present

## 2014-05-10 DIAGNOSIS — M961 Postlaminectomy syndrome, not elsewhere classified: Secondary | ICD-10-CM | POA: Diagnosis not present

## 2014-05-13 DIAGNOSIS — M4120 Other idiopathic scoliosis, site unspecified: Secondary | ICD-10-CM | POA: Diagnosis present

## 2014-05-13 DIAGNOSIS — T819XXA Unspecified complication of procedure, initial encounter: Secondary | ICD-10-CM | POA: Insufficient documentation

## 2014-05-17 ENCOUNTER — Other Ambulatory Visit: Payer: Self-pay | Admitting: Internal Medicine

## 2014-05-17 DIAGNOSIS — M4184 Other forms of scoliosis, thoracic region: Secondary | ICD-10-CM

## 2014-05-20 DIAGNOSIS — M47817 Spondylosis without myelopathy or radiculopathy, lumbosacral region: Secondary | ICD-10-CM | POA: Diagnosis not present

## 2014-05-20 DIAGNOSIS — M5137 Other intervertebral disc degeneration, lumbosacral region: Secondary | ICD-10-CM | POA: Diagnosis not present

## 2014-05-20 DIAGNOSIS — M419 Scoliosis, unspecified: Secondary | ICD-10-CM | POA: Diagnosis not present

## 2014-05-20 DIAGNOSIS — M5127 Other intervertebral disc displacement, lumbosacral region: Secondary | ICD-10-CM | POA: Diagnosis not present

## 2014-05-26 ENCOUNTER — Other Ambulatory Visit: Payer: Self-pay

## 2014-05-28 ENCOUNTER — Other Ambulatory Visit: Payer: Self-pay

## 2014-08-10 DIAGNOSIS — M961 Postlaminectomy syndrome, not elsewhere classified: Secondary | ICD-10-CM | POA: Diagnosis not present

## 2014-08-10 DIAGNOSIS — M4120 Other idiopathic scoliosis, site unspecified: Secondary | ICD-10-CM | POA: Diagnosis not present

## 2014-08-16 DIAGNOSIS — M810 Age-related osteoporosis without current pathological fracture: Secondary | ICD-10-CM | POA: Diagnosis not present

## 2014-08-16 DIAGNOSIS — M4120 Other idiopathic scoliosis, site unspecified: Secondary | ICD-10-CM | POA: Diagnosis not present

## 2014-08-16 DIAGNOSIS — M4014 Other secondary kyphosis, thoracic region: Secondary | ICD-10-CM | POA: Diagnosis not present

## 2014-09-02 DIAGNOSIS — E785 Hyperlipidemia, unspecified: Secondary | ICD-10-CM | POA: Diagnosis not present

## 2014-09-02 DIAGNOSIS — Z01810 Encounter for preprocedural cardiovascular examination: Secondary | ICD-10-CM | POA: Diagnosis not present

## 2014-09-02 DIAGNOSIS — R0602 Shortness of breath: Secondary | ICD-10-CM | POA: Diagnosis not present

## 2014-09-02 DIAGNOSIS — I10 Essential (primary) hypertension: Secondary | ICD-10-CM | POA: Diagnosis not present

## 2014-09-09 DIAGNOSIS — G039 Meningitis, unspecified: Secondary | ICD-10-CM | POA: Diagnosis not present

## 2014-09-09 DIAGNOSIS — M419 Scoliosis, unspecified: Secondary | ICD-10-CM | POA: Diagnosis not present

## 2014-09-09 DIAGNOSIS — M47816 Spondylosis without myelopathy or radiculopathy, lumbar region: Secondary | ICD-10-CM | POA: Diagnosis not present

## 2014-09-09 DIAGNOSIS — Z981 Arthrodesis status: Secondary | ICD-10-CM | POA: Diagnosis not present

## 2014-09-09 DIAGNOSIS — M4185 Other forms of scoliosis, thoracolumbar region: Secondary | ICD-10-CM | POA: Diagnosis not present

## 2014-09-13 DIAGNOSIS — R0602 Shortness of breath: Secondary | ICD-10-CM | POA: Diagnosis not present

## 2014-09-23 DIAGNOSIS — E782 Mixed hyperlipidemia: Secondary | ICD-10-CM | POA: Diagnosis not present

## 2014-09-23 DIAGNOSIS — I10 Essential (primary) hypertension: Secondary | ICD-10-CM | POA: Diagnosis not present

## 2014-09-23 DIAGNOSIS — N390 Urinary tract infection, site not specified: Secondary | ICD-10-CM | POA: Diagnosis not present

## 2014-09-23 DIAGNOSIS — R8299 Other abnormal findings in urine: Secondary | ICD-10-CM | POA: Diagnosis not present

## 2014-09-29 DIAGNOSIS — F418 Other specified anxiety disorders: Secondary | ICD-10-CM | POA: Diagnosis not present

## 2014-09-29 DIAGNOSIS — R0602 Shortness of breath: Secondary | ICD-10-CM | POA: Diagnosis not present

## 2014-09-29 DIAGNOSIS — Z6826 Body mass index (BMI) 26.0-26.9, adult: Secondary | ICD-10-CM | POA: Diagnosis not present

## 2014-09-29 DIAGNOSIS — G2581 Restless legs syndrome: Secondary | ICD-10-CM | POA: Diagnosis not present

## 2014-09-29 DIAGNOSIS — E782 Mixed hyperlipidemia: Secondary | ICD-10-CM | POA: Diagnosis not present

## 2014-09-29 DIAGNOSIS — M545 Low back pain: Secondary | ICD-10-CM | POA: Diagnosis not present

## 2014-09-29 DIAGNOSIS — Z0000 Encounter for general adult medical examination without abnormal findings: Secondary | ICD-10-CM | POA: Diagnosis not present

## 2014-09-29 DIAGNOSIS — I10 Essential (primary) hypertension: Secondary | ICD-10-CM | POA: Diagnosis not present

## 2014-09-29 DIAGNOSIS — Z1389 Encounter for screening for other disorder: Secondary | ICD-10-CM | POA: Diagnosis not present

## 2014-10-05 DIAGNOSIS — M961 Postlaminectomy syndrome, not elsewhere classified: Secondary | ICD-10-CM | POA: Diagnosis not present

## 2014-10-05 DIAGNOSIS — I10 Essential (primary) hypertension: Secondary | ICD-10-CM | POA: Diagnosis present

## 2014-10-05 DIAGNOSIS — Z9189 Other specified personal risk factors, not elsewhere classified: Secondary | ICD-10-CM

## 2014-10-05 DIAGNOSIS — Z8659 Personal history of other mental and behavioral disorders: Secondary | ICD-10-CM | POA: Diagnosis not present

## 2014-10-05 DIAGNOSIS — M4120 Other idiopathic scoliosis, site unspecified: Secondary | ICD-10-CM | POA: Diagnosis not present

## 2014-10-19 DIAGNOSIS — Z23 Encounter for immunization: Secondary | ICD-10-CM | POA: Diagnosis not present

## 2014-10-26 DIAGNOSIS — J9690 Respiratory failure, unspecified, unspecified whether with hypoxia or hypercapnia: Secondary | ICD-10-CM | POA: Diagnosis not present

## 2014-10-26 DIAGNOSIS — K5900 Constipation, unspecified: Secondary | ICD-10-CM | POA: Diagnosis not present

## 2014-10-26 DIAGNOSIS — F419 Anxiety disorder, unspecified: Secondary | ICD-10-CM | POA: Diagnosis present

## 2014-10-26 DIAGNOSIS — D649 Anemia, unspecified: Secondary | ICD-10-CM | POA: Diagnosis not present

## 2014-10-26 DIAGNOSIS — M5136 Other intervertebral disc degeneration, lumbar region: Secondary | ICD-10-CM | POA: Diagnosis not present

## 2014-10-26 DIAGNOSIS — R768 Other specified abnormal immunological findings in serum: Secondary | ICD-10-CM | POA: Diagnosis not present

## 2014-10-26 DIAGNOSIS — F329 Major depressive disorder, single episode, unspecified: Secondary | ICD-10-CM | POA: Diagnosis not present

## 2014-10-26 DIAGNOSIS — M5137 Other intervertebral disc degeneration, lumbosacral region: Secondary | ICD-10-CM | POA: Diagnosis present

## 2014-10-26 DIAGNOSIS — G8918 Other acute postprocedural pain: Secondary | ICD-10-CM | POA: Diagnosis not present

## 2014-10-26 DIAGNOSIS — M4326 Fusion of spine, lumbar region: Secondary | ICD-10-CM | POA: Diagnosis not present

## 2014-10-26 DIAGNOSIS — M960 Pseudarthrosis after fusion or arthrodesis: Secondary | ICD-10-CM | POA: Diagnosis not present

## 2014-10-26 DIAGNOSIS — M545 Low back pain: Secondary | ICD-10-CM | POA: Diagnosis not present

## 2014-10-26 DIAGNOSIS — M4015 Other secondary kyphosis, thoracolumbar region: Secondary | ICD-10-CM | POA: Diagnosis not present

## 2014-10-26 DIAGNOSIS — M40205 Unspecified kyphosis, thoracolumbar region: Secondary | ICD-10-CM | POA: Diagnosis not present

## 2014-10-26 DIAGNOSIS — R918 Other nonspecific abnormal finding of lung field: Secondary | ICD-10-CM | POA: Diagnosis not present

## 2014-10-26 DIAGNOSIS — Z4682 Encounter for fitting and adjustment of non-vascular catheter: Secondary | ICD-10-CM | POA: Diagnosis not present

## 2014-10-26 DIAGNOSIS — Z981 Arthrodesis status: Secondary | ICD-10-CM | POA: Diagnosis not present

## 2014-10-26 DIAGNOSIS — Z8659 Personal history of other mental and behavioral disorders: Secondary | ICD-10-CM | POA: Diagnosis not present

## 2014-10-26 DIAGNOSIS — M549 Dorsalgia, unspecified: Secondary | ICD-10-CM | POA: Diagnosis not present

## 2014-10-26 DIAGNOSIS — M4320 Fusion of spine, site unspecified: Secondary | ICD-10-CM | POA: Diagnosis not present

## 2014-10-26 DIAGNOSIS — I10 Essential (primary) hypertension: Secondary | ICD-10-CM | POA: Diagnosis not present

## 2014-10-26 DIAGNOSIS — M546 Pain in thoracic spine: Secondary | ICD-10-CM | POA: Diagnosis not present

## 2014-10-26 DIAGNOSIS — M4185 Other forms of scoliosis, thoracolumbar region: Secondary | ICD-10-CM | POA: Diagnosis present

## 2014-10-26 DIAGNOSIS — G8929 Other chronic pain: Secondary | ICD-10-CM | POA: Diagnosis present

## 2014-10-26 DIAGNOSIS — M6250 Muscle wasting and atrophy, not elsewhere classified, unspecified site: Secondary | ICD-10-CM | POA: Diagnosis not present

## 2014-10-26 DIAGNOSIS — Z4889 Encounter for other specified surgical aftercare: Secondary | ICD-10-CM | POA: Diagnosis not present

## 2014-10-26 DIAGNOSIS — M4120 Other idiopathic scoliosis, site unspecified: Secondary | ICD-10-CM | POA: Diagnosis not present

## 2014-10-26 DIAGNOSIS — M6281 Muscle weakness (generalized): Secondary | ICD-10-CM | POA: Diagnosis not present

## 2014-10-26 DIAGNOSIS — R2681 Unsteadiness on feet: Secondary | ICD-10-CM | POA: Diagnosis not present

## 2014-10-26 HISTORY — PX: POSTERIOR FUSION SPINAL DEFORMITY: SUR1044

## 2014-10-26 LAB — POCT INR: INR: 1.1 (ref <=1.1)

## 2014-10-26 LAB — PROTIME-INR: PROTIME: 27.7 s — AB (ref 10.0–13.8)

## 2014-10-27 LAB — BASIC METABOLIC PANEL
BUN: 8 mg/dL (ref 4–21)
CREATININE: 0.5 mg/dL (ref <=1.1)
Potassium: 3.1 mmol/L — AB (ref 3.4–5.3)
Sodium: 136 mmol/L — AB (ref 137–147)

## 2014-10-27 LAB — CBC AND DIFFERENTIAL
HEMATOCRIT: 36 % (ref 36–46)
Platelets: 119 10*3/uL — AB (ref 150–399)
WBC: 7.8 10*3/mL

## 2014-11-02 DIAGNOSIS — M549 Dorsalgia, unspecified: Secondary | ICD-10-CM | POA: Diagnosis not present

## 2014-11-02 DIAGNOSIS — K5900 Constipation, unspecified: Secondary | ICD-10-CM | POA: Diagnosis not present

## 2014-11-02 DIAGNOSIS — Z981 Arthrodesis status: Secondary | ICD-10-CM | POA: Diagnosis not present

## 2014-11-02 DIAGNOSIS — M4120 Other idiopathic scoliosis, site unspecified: Secondary | ICD-10-CM | POA: Diagnosis not present

## 2014-11-02 DIAGNOSIS — M4326 Fusion of spine, lumbar region: Secondary | ICD-10-CM | POA: Diagnosis not present

## 2014-11-02 DIAGNOSIS — I10 Essential (primary) hypertension: Secondary | ICD-10-CM | POA: Diagnosis not present

## 2014-11-02 DIAGNOSIS — M40205 Unspecified kyphosis, thoracolumbar region: Secondary | ICD-10-CM | POA: Diagnosis not present

## 2014-11-02 DIAGNOSIS — R5381 Other malaise: Secondary | ICD-10-CM | POA: Diagnosis not present

## 2014-11-02 DIAGNOSIS — D62 Acute posthemorrhagic anemia: Secondary | ICD-10-CM | POA: Diagnosis not present

## 2014-11-02 DIAGNOSIS — E876 Hypokalemia: Secondary | ICD-10-CM | POA: Diagnosis not present

## 2014-11-02 DIAGNOSIS — K219 Gastro-esophageal reflux disease without esophagitis: Secondary | ICD-10-CM | POA: Diagnosis not present

## 2014-11-02 DIAGNOSIS — S3219XA Other fracture of sacrum, initial encounter for closed fracture: Secondary | ICD-10-CM | POA: Diagnosis not present

## 2014-11-02 DIAGNOSIS — S22028A Other fracture of second thoracic vertebra, initial encounter for closed fracture: Secondary | ICD-10-CM | POA: Diagnosis not present

## 2014-11-02 DIAGNOSIS — M79604 Pain in right leg: Secondary | ICD-10-CM | POA: Diagnosis not present

## 2014-11-02 DIAGNOSIS — G8929 Other chronic pain: Secondary | ICD-10-CM | POA: Diagnosis not present

## 2014-11-02 DIAGNOSIS — M4320 Fusion of spine, site unspecified: Secondary | ICD-10-CM | POA: Diagnosis not present

## 2014-11-02 DIAGNOSIS — R2681 Unsteadiness on feet: Secondary | ICD-10-CM | POA: Diagnosis not present

## 2014-11-02 DIAGNOSIS — M545 Low back pain: Secondary | ICD-10-CM | POA: Diagnosis not present

## 2014-11-02 DIAGNOSIS — F329 Major depressive disorder, single episode, unspecified: Secondary | ICD-10-CM | POA: Diagnosis not present

## 2014-11-02 DIAGNOSIS — M6281 Muscle weakness (generalized): Secondary | ICD-10-CM | POA: Diagnosis not present

## 2014-11-02 DIAGNOSIS — Z79890 Hormone replacement therapy (postmenopausal): Secondary | ICD-10-CM | POA: Diagnosis not present

## 2014-11-02 DIAGNOSIS — M6250 Muscle wasting and atrophy, not elsewhere classified, unspecified site: Secondary | ICD-10-CM | POA: Diagnosis not present

## 2014-11-03 ENCOUNTER — Encounter: Payer: Self-pay | Admitting: Adult Health

## 2014-11-03 ENCOUNTER — Non-Acute Institutional Stay (SKILLED_NURSING_FACILITY): Payer: Medicare Other | Admitting: Adult Health

## 2014-11-03 DIAGNOSIS — M4120 Other idiopathic scoliosis, site unspecified: Secondary | ICD-10-CM

## 2014-11-03 DIAGNOSIS — K5900 Constipation, unspecified: Secondary | ICD-10-CM

## 2014-11-03 DIAGNOSIS — Z79890 Hormone replacement therapy (postmenopausal): Secondary | ICD-10-CM | POA: Diagnosis not present

## 2014-11-03 DIAGNOSIS — I10 Essential (primary) hypertension: Secondary | ICD-10-CM | POA: Diagnosis not present

## 2014-11-03 DIAGNOSIS — D62 Acute posthemorrhagic anemia: Secondary | ICD-10-CM

## 2014-11-03 DIAGNOSIS — K219 Gastro-esophageal reflux disease without esophagitis: Secondary | ICD-10-CM

## 2014-11-03 DIAGNOSIS — R5381 Other malaise: Secondary | ICD-10-CM | POA: Diagnosis not present

## 2014-11-03 DIAGNOSIS — F32A Depression, unspecified: Secondary | ICD-10-CM

## 2014-11-03 DIAGNOSIS — F329 Major depressive disorder, single episode, unspecified: Secondary | ICD-10-CM | POA: Diagnosis not present

## 2014-11-05 ENCOUNTER — Non-Acute Institutional Stay (SKILLED_NURSING_FACILITY): Payer: Medicare Other | Admitting: Internal Medicine

## 2014-11-05 DIAGNOSIS — R2681 Unsteadiness on feet: Secondary | ICD-10-CM | POA: Diagnosis not present

## 2014-11-05 DIAGNOSIS — G8929 Other chronic pain: Secondary | ICD-10-CM | POA: Diagnosis not present

## 2014-11-05 DIAGNOSIS — K219 Gastro-esophageal reflux disease without esophagitis: Secondary | ICD-10-CM

## 2014-11-05 DIAGNOSIS — E876 Hypokalemia: Secondary | ICD-10-CM

## 2014-11-05 DIAGNOSIS — K5900 Constipation, unspecified: Secondary | ICD-10-CM | POA: Diagnosis not present

## 2014-11-05 DIAGNOSIS — D62 Acute posthemorrhagic anemia: Secondary | ICD-10-CM

## 2014-11-05 DIAGNOSIS — F329 Major depressive disorder, single episode, unspecified: Secondary | ICD-10-CM

## 2014-11-05 DIAGNOSIS — R5381 Other malaise: Secondary | ICD-10-CM

## 2014-11-05 DIAGNOSIS — I10 Essential (primary) hypertension: Secondary | ICD-10-CM

## 2014-11-05 DIAGNOSIS — M6281 Muscle weakness (generalized): Secondary | ICD-10-CM | POA: Diagnosis not present

## 2014-11-05 DIAGNOSIS — M4120 Other idiopathic scoliosis, site unspecified: Secondary | ICD-10-CM | POA: Diagnosis not present

## 2014-11-05 DIAGNOSIS — F32A Depression, unspecified: Secondary | ICD-10-CM

## 2014-11-05 NOTE — Progress Notes (Signed)
Patient ID: Deborah Sellers, female   DOB: 03/25/1945, 69 y.o.   MRN: [id number]     [facility]   PCP: Garlan Fillers, MD  Code Status: full code  No Known Allergies  Chief Complaint  Patient presents with  . New Admit To SNF     HPI:  69 y.o. patient is here for short term rehabilitation post hospital admission from 10/26/14-11/02/14 with progressive back and right lower extremity pain. She underwent elective revision of T4-pelvis fusion, T12-L1 transforaminal lumbar interbody fusion, L5-S1 anterior lumbar interbody fusion. She is seen in her room today. Her back pain is bothering her. This prevented her from resting last night. She was started on scheduled dilaudid 2 mg tid on 11/04/14 with her prn dilaudid. Her and her husband would not want dilaudid regimen changed and would like fentanyl dose increased  Review of Systems:  Constitutional: Negative for fever, chills, diaphoresis.  HENT: Negative for headache, congestion, nasal discharge Eyes: Negative for eye pain, blurred vision, double vision and discharge.  Respiratory: Negative for cough, shortness of breath and wheezing.   Cardiovascular: Negative for chest pain, palpitations, leg swelling.  Gastrointestinal: Negative for heartburn, nausea, vomiting, abdominal pain. Bowel movement today Genitourinary: Negative for dysuria, flank pain, urinary incontinence Musculoskeletal: Negative for falls. Has some pain from lower back radiating to both the buttocks Skin: Negative for itching, sores and rash.  Neurological: Negative for dizziness, tingling, focal weakness. positive for generalized weakness Psychiatric/Behavioral: Negative for depression    Past Medical History  Diagnosis Date  . Anxiety   . History of blood transfusion     related to surgery with own blood replacement  . Hyperlipidemia   . Hypertension   . Kyphoscoliosis   . Post laminectomy syndrome    Past Surgical History    Procedure Laterality Date  . Back surgery  2007  . Diaphragmatic hernia repair    . Colonoscopy    . Posterior fusion spinal deformity  9.6.2016   Social History:   reports that she has never smoked. She has never used smokeless tobacco. She reports that she drinks about 1.8 - 2.4 oz of alcohol per week. She reports that she does not use illicit drugs.  Family History  Problem Relation Age of Onset  . Colon cancer Neg Hx   . Esophageal cancer Neg Hx   . Rectal cancer Neg Hx   . Stomach cancer Neg Hx     Medications:   Medication List       This list is accurate as of: 11/05/14 12:44 PM.  Always use your most recent med list.               acetaminophen 325 MG tablet  Commonly known as:  TYLENOL  Take 650 mg by mouth every 8 (eight) hours as needed.     b complex vitamins tablet  Take 1 tablet by mouth daily.     bisacodyl 10 MG suppository  Commonly known as:  DULCOLAX  Place 10 mg rectally as needed for moderate constipation (For up to 10 days).     DIOVAN HCT 160-12.5 MG per tablet  Generic drug:  valsartan-hydrochlorothiazide  Take 1 tablet by mouth daily.     escitalopram 10 MG tablet  Commonly known as:  LEXAPRO  Take 10 mg by mouth daily.     estradiol 0.025 MG/24HR  Commonly known as:  VIVELLE-DOT  Place 1 patch onto the skin 2 (two) times a week.  fentaNYL 12 MCG/HR  Commonly known as:  DURAGESIC - dosed mcg/hr  Place onto the skin as needed.     HYDROmorphone 2 MG tablet  Commonly known as:  DILAUDID  1-2 tablets by mouth every 4 hours as needed x 10 days ( 1 for pain 4-7/10, 2 for pain 8-10/10)     methocarbamol 500 MG tablet  Commonly known as:  ROBAXIN  Take 500 mg by mouth daily.     ondansetron 4 MG tablet  Commonly known as:  ZOFRAN  Take 4 mg by mouth every 6 (six) hours as needed for nausea or vomiting (up to 7 days).     polyethylene glycol packet  Commonly known as:  MIRALAX / GLYCOLAX  Take 17 g by mouth daily.      pregabalin 100 MG capsule  Commonly known as:  LYRICA  Take 100 mg by mouth every 12 (twelve) hours.     ranitidine 150 MG tablet  Commonly known as:  ZANTAC  Take 150 mg by mouth 2 (two) times daily as needed for heartburn.     senna-docusate 8.6-50 MG per tablet  Commonly known as:  Senokot-S  Take 2 tablets by mouth 2 (two) times daily.         Physical Exam: Filed Vitals:   11/05/14 1153  BP: 131/80  Pulse: 88  Temp: 98.1 F (36.7 C)  Resp: 18  Weight: 147 lb 9.6 oz (66.951 kg)  SpO2: 97%    General- elderly female, well built, in no acute distress Head- normocephalic, atraumatic Nose- normal nasal mucosa, no maxillary or frontal sinus tenderness, no nasal discharge Throat- moist mucus membrane, normal oropharynx Eyes- PERRLA, EOMI, no pallor, no icterus, no discharge, normal conjunctiva, normal sclera Neck- no cervical lymphadenopathy Cardiovascular- normal s1,s2, no murmurs, palpable dorsalis pedis and radial pulses, no leg edema Respiratory- bilateral clear to auscultation, no wheeze, no rhonchi, no crackles, no use of accessory muscles Abdomen- bowel sounds present, soft, non tender Musculoskeletal- able to move all 4 extremities, back brace in place, surgical incision healing well with staples in place, right thoracic paraspinal area with suture in place, generalized weakness, transfers with help of walker and one person assist in and out of bed and wheelchair Neurological- no focal deficit, alert and oriented to person, normal sensation  Skin- warm and dry, abdominal wall surgical incision with staples in place and healing well Psychiatry- normal mood and affect    Labs reviewed: Basic Metabolic Panel:  Recent Labs  16/10/96  NA 136*  K 3.1*  BUN 8  CREATININE 0.5   CBC:  Recent Labs  10/27/14  WBC 7.8  HCT 36  PLT 119*    Radiological Exams: Ct Thoracic Spine Wo Contrast  08/12/2007   Clinical Data:  69 year old female with back pain and new  left flank pain status post lumbar fusion in 2007 with hardware revision in July 2008.   CT THORACIC SPINE WITHOUT CONTRAST   Technique: Multidetector CT imaging of the thoracic spine was performed without intravenous contrast administration. Multiplanar CT image reconstructions were also generated.   Comparison: 07/10/2006.   Findings:  Visualized noncontrasted mediastinal structures and upper abdominal viscera are stable and within normal limits. Visualized lung parenchyma remarkable for mild atelectasis. Visualized paraspinal soft tissues are stable with postoperative changes beginning at the T12 level and extending into the lumbar spine.   Chronic bilateral posterior rib deformities are stable.   Dextroconvex scoliosis of the thoracolumbar junction and focal increased kyphosis at  the T11 - L1 level are re-identified and appear not significantly changed.  The upper thoracic vertebrae are stable and intact.  Rotatory scoliosis is noted beginning at the T8- T9 level and is stable.   No upper thoracic neural foraminal or spinal stenosis.  Suggestion of T3-T4 disc protrusion on the sagittal images not correlated on axial images.  There is a disc extrusion at T7-T8 which is broad- based, stable and without significant spinal stenosis suspected. Lesser T10-T11 circumferential disc herniation with prominent right lateral endplate osteophytes is stable.   The right pedicle screw at T12 has been removed.  The right connecting rod has been shortened and no longer extends into the thoracic spine (see additional details in the lumbar section below).  Sequelae of decompressive laminectomy from T12 extending inferiorly re-identified.  No acute osseous abnormality at these levels.   IMPRESSION:   1.  No adverse features status post right T12 hardware removal. 2.  No acute osseous abnormality in the thoracic spine. 3.  Stable T8-T9 and T10-T11 disc degeneration.   CT LUMBAR SPINE WITHOUT CONTRAST   Technique:  Multidetector CT  imaging of the lumbar spine was performed without intravenous contrast administration.  Multiplanar CT image reconstructions were also generated.   Comparison: 07/10/2006.   Findings:  Visualized abdominal and pelvic viscera are within normal limits.  No left obstructive uropathy or nephrolithiasis. Stable postoperative changes to the lumbar paraspinal soft tissues. Sequelae of prior T12-L5 bilateral posterior spinal fusion are noted with interval removal of the T12 and L1 pedicle screws on the right.  The connecting rods on the right has been shortened to extend from the L2 - L5 levels.  Stable osteopenia, lumbar vertebral body height, alignment and dextroconvex thoracolumbar scoliosis.   T12-L1:  Stable partial laminectomies.  As before, the left L1 pedicle screw does not traverse the left L1 pedicle or vertebral body, extending through the lamina.  Stable spinal canal and neural foraminal patency at this level.   L1-L2:  Interbody fusion with solid osseous union suspected, stable.  Stable bilateral L2 transpedicular hardware, the left screw traversing the outer margin of the pedicle as before. Sequelae of decompressive laminectomy.  Stable spinal canal and neural foraminal patency.   L2-L3:  Interbody fusion with solid osseous union, stable. Bilateral L3 transpedicular hardware appears stable and without adverse feature.  Decompressive laminectomy.  Stable spinal canal and thecal sac patency.   L3-L4:  Interbody fusion with solid osseous union, stable. Bilateral L4 transpedicular hardware appears stable and intact. Decompressive laminectomy.  Stable spinal canal and neural foraminal patency.   L4-L5:  Stable appearance of the interbody fusion at this level without solid osseous union.  No interval subsidence of interbody material.  Lucency about the left L5 pedicle screw is unchanged. Hardware at this level appears stable and intact.  Stable decompressive laminectomy.  Stable patency of the spinal canal and  neural foramen.   L5-S1:  Right eccentric circumferential disc protrusion is noted and abuts the exiting right L5 nerve root.  This is more conspicuous than on the prior exam (series 4 image 170).  Left L5 neural foramen is within normal limits.  No spinal stenosis.   The sacrum and visualized iliac bones appears stable and intact. Sequelae of bone graft harvest noted to the medial iliac wings bilaterally.   IMPRESSION:   1.  Interval posterior spinal hardware revision at L1 as detailed above. 2.  Otherwise unchanged lumbar fusion hardware L1-L5 as detailed above.  Stable lucency surrounding  the left L5 pedicle screw. 3.  No acute osseous abnormality.  Solid osseous interbody fusion L1 through L4.  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   4.  Increased conspicuity of right foraminal disc material at L5-S1 abutting the exiting right L5 nerve root.  Correlate for corresponding radiculitis. 5.  Otherwise stable spinal canal and neural foraminal patency of lumbar spine.  Deborah Sellers: Deborah Sellers  Ct Lumbar Spine Wo Contrast  08/12/2007   Clinical Data:  69 year old female with back pain and new left flank pain status post lumbar fusion in 2007 with hardware revision in July 2008.   CT THORACIC SPINE WITHOUT CONTRAST   Technique: Multidetector CT imaging of the thoracic spine was performed without intravenous contrast administration. Multiplanar CT image reconstructions were also generated.   Comparison: 07/10/2006.   Findings:  Visualized noncontrasted mediastinal structures and upper abdominal viscera are stable and within normal limits. Visualized lung parenchyma remarkable for mild atelectasis. Visualized paraspinal soft tissues are stable with postoperative changes beginning at the T12 level and extending into the lumbar spine.   Chronic bilateral posterior rib deformities are stable.   Dextroconvex scoliosis of the thoracolumbar junction and focal increased kyphosis at the T11 - L1 level are re-identified and appear not  significantly changed.  The upper thoracic vertebrae are stable and intact.  Rotatory scoliosis is noted beginning at the T8- T9 level and is stable.   No upper thoracic neural foraminal or spinal stenosis.  Suggestion of T3-T4 disc protrusion on the sagittal images not correlated on axial images.  There is a disc extrusion at T7-T8 which is broad- based, stable and without significant spinal stenosis suspected. Lesser T10-T11 circumferential disc herniation with prominent right lateral endplate osteophytes is stable.   The right pedicle screw at T12 has been removed.  The right connecting rod has been shortened and no longer extends into the thoracic spine (see additional details in the lumbar section below).  Sequelae of decompressive laminectomy from T12 extending inferiorly re-identified.  No acute osseous abnormality at these levels.   IMPRESSION:   1.  No adverse features status post right T12 hardware removal. 2.  No acute osseous abnormality in the thoracic spine. 3.  Stable T8-T9 and T10-T11 disc degeneration.   CT LUMBAR SPINE WITHOUT CONTRAST   Technique:  Multidetector CT imaging of the lumbar spine was performed without intravenous contrast administration.  Multiplanar CT image reconstructions were also generated.   Comparison: 07/10/2006.   Findings:  Visualized abdominal and pelvic viscera are within normal limits.  No left obstructive uropathy or nephrolithiasis. Stable postoperative changes to the lumbar paraspinal soft tissues. Sequelae of prior T12-L5 bilateral posterior spinal fusion are noted with interval removal of the T12 and L1 pedicle screws on the right.  The connecting rods on the right has been shortened to extend from the L2 - L5 levels.  Stable osteopenia, lumbar vertebral body height, alignment and dextroconvex thoracolumbar scoliosis.   T12-L1:  Stable partial laminectomies.  As before, the left L1 pedicle screw does not traverse the left L1 pedicle or vertebral body, extending  through the lamina.  Stable spinal canal and neural foraminal patency at this level.   L1-L2:  Interbody fusion with solid osseous union suspected, stable.  Stable bilateral L2 transpedicular hardware, the left screw traversing the outer margin of the pedicle as before. Sequelae of decompressive laminectomy.  Stable spinal canal and neural foraminal patency.   L2-L3:  Interbody fusion with solid osseous union, stable. Bilateral L3 transpedicular hardware appears stable  and without adverse feature.  Decompressive laminectomy.  Stable spinal canal and thecal sac patency.   L3-L4:  Interbody fusion with solid osseous union, stable. Bilateral L4 transpedicular hardware appears stable and intact. Decompressive laminectomy.  Stable spinal canal and neural foraminal patency.   L4-L5:  Stable appearance of the interbody fusion at this level without solid osseous union.  No interval subsidence of interbody material.  Lucency about the left L5 pedicle screw is unchanged. Hardware at this level appears stable and intact.  Stable decompressive laminectomy.  Stable patency of the spinal canal and neural foramen.   L5-S1:  Right eccentric circumferential disc protrusion is noted and abuts the exiting right L5 nerve root.  This is more conspicuous than on the prior exam (series 4 image 170).  Left L5 neural foramen is within normal limits.  No spinal stenosis.   The sacrum and visualized iliac bones appears stable and intact. Sequelae of bone graft harvest noted to the medial iliac wings bilaterally.   IMPRESSION:   1.  Interval posterior spinal hardware revision at L1 as detailed above. 2.  Otherwise unchanged lumbar fusion hardware L1-L5 as detailed above.  Stable lucency surrounding the left L5 pedicle screw. 3.  No acute osseous abnormality.  Solid osseous interbody fusion L1 through L4.  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   4.  Increased conspicuity of right foraminal disc material at L5-S1 abutting the exiting right L5 nerve root.   Correlate for corresponding radiculitis. 5.  Otherwise stable spinal canal and neural foraminal patency of lumbar spine.  Deborah Sellers: Deborah Sellers   Assessment/Plan  Physical deconditioning Will have her work with physical therapy and occupational therapy team to help with gait training and muscle strengthening exercises.fall precautions. Skin care. Encourage to be out of bed.   Scoliosis S/prevision T4-pelvis fusion, T12-L1 transforaminal lumbar interbody fusion, L5-S1 anterior lumbar interbody fusion. Currently on dilaudid 2 mg tid with 2 mg 1-2 tab q4h prn. Increase fentanyl patch to 25 mcg q72 h and reassess. To be seen by physiatry for non medication intervention. Continue robaxin 500 mg daily with lyrica 100 mg bid to help with muscle spasm. To wear back brace when out of bed all the time and to f/u with neurosurgery. Will have patient work with PT/OT as tolerated to regain strength and restore function.  Fall precautions are in place.   Acute blood loss anemia S/p 3 u prbc transfusion in hospital. Monitor cbc  HTN Stable, continue diovan-hctz 160-12.5 mg daily and check bmp with hypokalemia in hospital  Depression Continue lexapro 10 mg daily and monitor her mood  Constipation Continue miralax daily senna s 2 tab daily with prn dulcolax suppository  gerd Continue zantac 150 mg bid prn and monitor  Hypokalemia Likely iatrogenic with her on hctz, check bmp   Goals of care: short term rehabilitation   Labs/tests ordered: cbc with diff, cmp  Family/ staff Communication: reviewed care plan with patient and nursing supervisor    Oneal Grout, MD  [facility] [phone] (Monday-Friday 8 am - 5 pm) [phone] (afterhours)

## 2014-11-09 ENCOUNTER — Other Ambulatory Visit: Payer: Self-pay | Admitting: *Deleted

## 2014-11-09 ENCOUNTER — Other Ambulatory Visit: Payer: Self-pay | Admitting: Physician Assistant

## 2014-11-09 ENCOUNTER — Ambulatory Visit
Admission: RE | Admit: 2014-11-09 | Discharge: 2014-11-09 | Disposition: A | Discharge status: Home / Self Care | Payer: Medicare Other | Source: Ambulatory Visit | Attending: Physician Assistant | Admitting: Physician Assistant

## 2014-11-09 DIAGNOSIS — M5441 Lumbago with sciatica, right side: Secondary | ICD-10-CM

## 2014-11-09 DIAGNOSIS — S3219XA Other fracture of sacrum, initial encounter for closed fracture: Secondary | ICD-10-CM | POA: Diagnosis not present

## 2014-11-09 DIAGNOSIS — S22028A Other fracture of second thoracic vertebra, initial encounter for closed fracture: Secondary | ICD-10-CM | POA: Diagnosis not present

## 2014-11-10 ENCOUNTER — Encounter: Payer: Self-pay | Admitting: Adult Health

## 2014-11-10 ENCOUNTER — Non-Acute Institutional Stay (SKILLED_NURSING_FACILITY): Payer: Medicare Other | Admitting: Adult Health

## 2014-11-10 DIAGNOSIS — M4120 Other idiopathic scoliosis, site unspecified: Secondary | ICD-10-CM

## 2014-11-10 DIAGNOSIS — F32A Depression, unspecified: Secondary | ICD-10-CM

## 2014-11-10 DIAGNOSIS — I10 Essential (primary) hypertension: Secondary | ICD-10-CM

## 2014-11-10 DIAGNOSIS — D62 Acute posthemorrhagic anemia: Secondary | ICD-10-CM | POA: Diagnosis not present

## 2014-11-10 DIAGNOSIS — K219 Gastro-esophageal reflux disease without esophagitis: Secondary | ICD-10-CM

## 2014-11-10 DIAGNOSIS — K5900 Constipation, unspecified: Secondary | ICD-10-CM

## 2014-11-10 DIAGNOSIS — F329 Major depressive disorder, single episode, unspecified: Secondary | ICD-10-CM

## 2014-11-10 NOTE — Progress Notes (Signed)
Patient ID: Deborah Sellers, female   DOB: 08/06/1945, 69 y.o.   MRN: [id number]    DATE:  11/03/14 MRN:  [id number]  BIRTHDAY: 05/28/1945  Facility:  Nursing Home Location:  [facility]  Nursing Home Room Number: [id number]  LEVEL OF CARE:  SNF (31)  Contact Information    Name Relation Home Work Mobile   Jamul Spouse [phone]     Rosealee Albee Daughter   [phone]      Chief Complaint  Patient presents with  . Hospitalization Follow-up    Physical deconditioning, scoliosis S/P revision of T4 pelvis fusion, T12-L1 transforaminal lumbar interbody fusion, L5-S1, anemia, hypertension, constipation, depression, HRT and GERD.    HISTORY OF PRESENT ILLNESS:  This is a 69 year old female who has been admitted to [facility] on 11/02/14 from [facility]. She has PMH of T12-L5 posterior fusion with multilevel transforaminal lumbar interbody fusions. She was having back and right lower extremity pain and feeling low for her legs buckling. She had revision of the fourth pill distribution, T12-L1 transforaminal lumbar interbody fusion, L5-S1 anterior lumbar interbody fusion on 10/26/14.  She has been admitted for a short-term rehabilitation.  PAST MEDICAL HISTORY:  Past Medical History  Diagnosis Date  . Anxiety   . History of blood transfusion     related to surgery with own blood replacement  . Hyperlipidemia   . Hypertension   . Kyphoscoliosis   . Post laminectomy syndrome      CURRENT MEDICATIONS: Reviewed  Patient's Medications  New Prescriptions   No medications on file  Previous Medications   ACETAMINOPHEN (TYLENOL) 325 MG TABLET    Take 650 mg by mouth every 8 (eight) hours as needed.   B COMPLEX VITAMINS TABLET    Take 1 tablet by mouth daily.   BISACODYL (DULCOLAX) 10 MG SUPPOSITORY    Place 10 mg rectally as needed for moderate constipation (For up to 10 days).   DIOVAN HCT 160-12.5 MG PER TABLET    Take 1 tablet by mouth daily.   ESCITALOPRAM  (LEXAPRO) 10 MG TABLET    Take 10 mg by mouth daily.   ESTRADIOL (VIVELLE-DOT) 0.025 MG/24HR    Place 1 patch onto the skin 2 (two) times a week.   FENTANYL (DURAGESIC - DOSED MCG/HR) 12 MCG/HR    Place onto the skin as needed.   HYDROMORPHONE (DILAUDID) 2 MG TABLET    1-2 tablets by mouth every 4 hours as needed x 10 days ( 1 for pain 4-7/10, 2 for pain 8-10/10)   METHOCARBAMOL (ROBAXIN) 500 MG TABLET    Take 500 mg by mouth daily.   ONDANSETRON (ZOFRAN) 4 MG TABLET    Take 4 mg by mouth every 6 (six) hours as needed for nausea or vomiting (up to 7 days).   POLYETHYLENE GLYCOL (MIRALAX / GLYCOLAX) PACKET    Take 17 g by mouth daily.   PREGABALIN (LYRICA) 100 MG CAPSULE    Take 100 mg by mouth every 12 (twelve) hours.   RANITIDINE (ZANTAC) 150 MG TABLET    Take 150 mg by mouth 2 (two) times daily as needed for heartburn.   SENNA-DOCUSATE (SENOKOT-S) 8.6-50 MG PER TABLET    Take 2 tablets by mouth 2 (two) times daily.  Modified Medications   No medications on file  Discontinued Medications   No medications on file     No Known Allergies   REVIEW OF SYSTEMS:  GENERAL: no change in appetite, no fatigue, no weight changes,  no fever, chills or weakness EYES: Denies change in vision, dry eyes, eye pain, itching or discharge EARS: Denies change in hearing, ringing in ears, or earache NOSE: Denies nasal congestion or epistaxis MOUTH and THROAT: Denies oral discomfort, gingival pain or bleeding, pain from teeth or hoarseness   RESPIRATORY: no cough, SOB, DOE, wheezing, hemoptysis CARDIAC: no chest pain, edema or palpitations GI: no abdominal pain, diarrhea, constipation, heart burn, nausea or vomiting GU: Denies dysuria, frequency, hematuria, incontinence, or discharge PSYCHIATRIC: Denies feeling of depression or anxiety. No report of hallucinations, insomnia, paranoia, or agitation   PHYSICAL EXAMINATION  GENERAL APPEARANCE: Well nourished. In no acute distress. Normal body habitus SKIN:   Surgical incision on back and midline lower abdomen with staples, dry and no erythema HEAD: Normal in size and contour. No evidence of trauma EYES: Lids open and close normally. No blepharitis, entropion or ectropion. PERRL. Conjunctivae are clear and sclerae are white. Lenses are without opacity EARS: Pinnae are normal. Patient hears normal voice tunes of the examiner MOUTH and THROAT: Lips are without lesions. Oral mucosa is moist and without lesions. Tongue is normal in shape, size, and color and without lesions NECK: supple, trachea midline, no neck masses, no thyroid tenderness, no thyromegaly LYMPHATICS: no LAN in the neck, no supraclavicular LAN RESPIRATORY: breathing is even & unlabored, BS CTAB CARDIAC: RRR, no murmur,no extra heart sounds, no edema GI: abdomen soft, normal BS, no masses, no tenderness, no hepatomegaly, no splenomegaly EXTREMITIES:  Able to move X 4 extremities PSYCHIATRIC: Alert and oriented X 3. Affect and behavior are appropriate  LABS/RADIOLOGY: Labs reviewed: Basic Metabolic Panel:  Recent Labs  16/10/96  NA 136*  K 3.1*  BUN 8  CREATININE 0.5   CBC:  Recent Labs  10/27/14  WBC 7.8  HCT 36  PLT 119*      ASSESSMENT/PLAN:  Physical deconditioning - for rehabilitation  Scoliosis S/P revision of T4 fell distribution, T12-L1 transforaminal lumbar interbody fusion, L5-S1 anterior lumbar interbody fusion - since husband" are both concerned about having pain at night, they have requested for pain medication to be given so will start Dilaudid 2 mg 1 tab by mouth every 12 midnight, 4 AM and 8 AM; continue Dilaudid 2 mg 1-2 tabs by mouth every 4 hours when necessary, fentanyl patch 12 g/hour 1 patch topically every 3 days, Lyrica 100 mg 1 capsule by mouth every 12 hours, acetaminophen 325 mg 3 tabs = 975 mg by mouth every 8 hours 10 days for pain; Robaxin 500 mg 1 tab by mouth every morning for muscle spasm; follow-up with Dr. Christene Lye, neurosurgery at  the [facility]; physiatry consult and treat  Anemia, acute blood loss - S/P transfusion of 3 units packed RBC  Hypertension - continue Hyzaar 50-12.5 mg 1 tab by mouth daily  Constipation - continue MiraLAX 17 g by mouth daily and Senokot S2 tabs by mouth twice a day  Depression - mood is stable; continue Lexapro 10 mg 1 tab by mouth every a.m.  HRT - patient requested estradiol to be discontinued  GERD - continue Zantac 150 mg by mouth twice a day when necessary     Goals of care:  Short-term rehabilitation   [facility], NP [facility] [phone]

## 2014-11-11 ENCOUNTER — Other Ambulatory Visit: Payer: Self-pay

## 2014-11-11 NOTE — Progress Notes (Addendum)
Patient ID: Deborah Sellers, female   DOB: 06/09/1945, 69 y.o.   MRN: [id number]    DATE:  11/10/14 MRN:  [id number]  BIRTHDAY: 1946-02-17  Facility:  Nursing Home Location:  [facility]  Nursing Home Room Number: [id number]  LEVEL OF CARE:  SNF (31)  Contact Information    Name Relation Home Work Mobile   [place] Spouse [phone]     Rosealee Albee Daughter   [phone]      Chief Complaint  Patient presents with  . Discharge Note    Scoliosis S/P revision of T4 pelvis fusion, T12-L1 transforaminal lumbar interbody fusion, L5-S1, anemia, hypertension, constipation, depression and GERD.    HISTORY OF PRESENT ILLNESS:  This is a 69 year old female who is for discharge home. Discharge was coordinated with [facility] , Lavella Lemons - NP to Dr. Christene Lye. Ambulance transportation was arranged. She has been admitted to [facility] on 11/02/14 from [facility]. She has PMH of T12-L5 posterior fusion with multilevel transforaminal lumbar interbody fusions. She  was having back and right lower extremity pain and feeling low for her legs buckling. She had revision of the fourth pill distribution, T12-L1 transforaminal lumbar interbody fusion, L5-S1 anterior lumbar interbody fusion on 10/26/14.  Patient has been having increasing back pain so CT of Lumbar Spine was done. No further therapy has been ordered until seen by Neurosurgery.  PAST MEDICAL HISTORY:  Past Medical History  Diagnosis Date  . Anxiety   . History of blood transfusion     related to surgery with own blood replacement  . Hyperlipidemia   . Hypertension   . Kyphoscoliosis   . Post laminectomy syndrome      CURRENT MEDICATIONS: Reviewed  Patient's Medications  New Prescriptions   No medications on file  Previous Medications   ACETAMINOPHEN (TYLENOL) 325 MG TABLET    Take 975 mg by mouth every 8 (eight) hours.   B COMPLEX VITAMINS TABLET    Take 1 tablet by mouth daily.   BISACODYL  (DULCOLAX) 10 MG SUPPOSITORY    Place 10 mg rectally as needed for moderate constipation (For up to 10 days).   DIOVAN HCT 160-12.5 MG PER TABLET    Take 1 tablet by mouth daily.   ESCITALOPRAM (LEXAPRO) 10 MG TABLET    Take 10 mg by mouth daily.   FENTANYL (DURAGESIC - DOSED MCG/HR) 12 MCG/HR    Place onto the skin as needed.   HYDROMORPHONE (DILAUDID) 2 MG TABLET    1-2 tablets by mouth every 4 hours as needed x 10 days ( 1 for pain 4-7/10, 2 for pain 8-10/10)   MENTHOL, TOPICAL ANALGESIC, (BIOFREEZE) 4 % GEL    Apply 4 % topically 2 (two) times daily. To bilateral shoulders   METHOCARBAMOL (ROBAXIN) 500 MG TABLET    Take 500 mg by mouth daily.   ONDANSETRON (ZOFRAN) 4 MG TABLET    Take 4 mg by mouth every 6 (six) hours as needed for nausea or vomiting (up to 7 days).   POLYETHYLENE GLYCOL (MIRALAX / GLYCOLAX) PACKET    Take 17 g by mouth daily.   PREGABALIN (LYRICA) 100 MG CAPSULE    Take 100 mg by mouth every 12 (twelve) hours.   RANITIDINE (ZANTAC) 150 MG TABLET    Take 150 mg by mouth 2 (two) times daily as needed for heartburn.   SENNA-DOCUSATE (SENOKOT-S) 8.6-50 MG PER TABLET    Take 2 tablets by mouth 2 (two) times daily.  Modified Medications  No medications on file  Discontinued Medications   ACETAMINOPHEN (TYLENOL) 325 MG TABLET    Take 650 mg by mouth every 8 (eight) hours as needed.   ESTRADIOL (VIVELLE-DOT) 0.025 MG/24HR    Place 1 patch onto the skin 2 (two) times a week.     No Known Allergies   REVIEW OF SYSTEMS:  GENERAL: no change in appetite, no fatigue, no weight changes,  chills or weakness EYES: Denies change in vision, dry eyes, eye pain, itching or discharge EARS: Denies change in hearing, ringing in ears, or earache NOSE: Denies nasal congestion or epistaxis MOUTH and THROAT: Denies oral discomfort, gingival pain or bleeding, pain from teeth or hoarseness   RESPIRATORY: no cough, SOB, DOE, wheezing, hemoptysis CARDIAC: no chest pain, edema or  palpitations GI: no abdominal pain, diarrhea, constipation, heart burn, nausea or vomiting GU: Denies dysuria, frequency, hematuria, incontinence, or discharge PSYCHIATRIC: Denies feeling of depression or anxiety. No report of hallucinations, insomnia, paranoia, or agitation   PHYSICAL EXAMINATION  GENERAL APPEARANCE: Well nourished. In no acute distress. Normal body habitus SKIN:  Surgical incision on back and midline lower abdomen with staples, dry and no erythema HEAD: Normal in size and contour. No evidence of trauma EYES: Lids open and close normally. No blepharitis, entropion or ectropion. PERRL. Conjunctivae are clear and sclerae are white. Lenses are without opacity EARS: Pinnae are normal. Patient hears normal voice tunes of the examiner MOUTH and THROAT: Lips are without lesions. Oral mucosa is moist and without lesions. Tongue is normal in shape, size, and color and without lesions NECK: supple, trachea midline, no neck masses, no thyroid tenderness, no thyromegaly LYMPHATICS: no LAN in the neck, no supraclavicular LAN RESPIRATORY: breathing is even & unlabored, BS CTAB CARDIAC: RRR, no murmur,no extra heart sounds, no edema GI: abdomen soft, normal BS, no masses, no tenderness, no hepatomegaly, no splenomegaly EXTREMITIES:  Able to move X 4 extremities PSYCHIATRIC: Alert and oriented X 3. Affect and behavior are appropriate  LABS/RADIOLOGY: Labs reviewed: 11/08/14  sodium 141 potassium 3.8 glucose 97 BUN 9 creatinine 0.43 calcium 8.4 total protein 5.5 albumin 2.77 total bilirubin 0.37 alkaline phosphatase 122 SGOT 17  SGPT 11 WBC 10.6 hemoglobin 10.9 hematocrit 33.7 MCV 93.6 platelet 750 Basic Metabolic Panel:  Recent Labs  16/10/96  NA 136*  K 3.1*  BUN 8  CREATININE 0.5   CBC:  Recent Labs  10/27/14  WBC 7.8  HCT 36  PLT 119*      ASSESSMENT/PLAN:  Scoliosis S/P revision of T4 fell distribution, T12-L1 transforaminal lumbar interbody fusion, L5-S1 anterior  lumbar interbody fusion -  continue Dilaudid 2 mg 1-2 tabs by mouth every 3 hours when necessary, fentanyl patch 12 g/hour 1 patch topically every 3 days, Lyrica 100 mg 1 capsule by mouth every 12 hours, acetaminophen 325 mg 3 tabs = 975 mg by mouth every 8 hours  for pain; Robaxin 500 mg 1 tab by mouth every morning for muscle spasm; follow-up with Dr. Christene Lye, neurosurgery at the [facility]; for staple removal today; TLSO brace @ all time when out of bed  Anemia, acute blood loss - S/P transfusion of 3 units packed RBC; hgb 10.9  Hypertension - continue Hyzaar 50-12.5 mg 1 tab by mouth daily  Constipation - continue MiraLAX 17 g by mouth daily and Senokot S2 tabs by mouth twice a day  Depression - mood is stable; continue Lexapro 10 mg 1 tab by mouth every a.m.  GERD - continue Zantac 150 mg  by mouth twice a day when necessary     I have filled out patient's discharge paperwork and written prescriptions.    Total discharge time: Greater than 30 minutes  I have coordinated /talked to patient and family, [facility], Dentist for transportation   [employer], NP [employer] [phone]

## 2014-11-11 NOTE — Addendum Note (Signed)
Addended by: Kenard Gower C on: 11/11/2014 12:48 AM   Modules accepted: Orders, Medications

## 2014-11-25 DIAGNOSIS — M545 Low back pain: Secondary | ICD-10-CM | POA: Diagnosis not present

## 2014-11-25 DIAGNOSIS — Z981 Arthrodesis status: Secondary | ICD-10-CM | POA: Diagnosis not present

## 2015-01-20 DIAGNOSIS — M4325 Fusion of spine, thoracolumbar region: Secondary | ICD-10-CM | POA: Diagnosis not present

## 2015-03-01 DIAGNOSIS — E785 Hyperlipidemia, unspecified: Secondary | ICD-10-CM | POA: Diagnosis not present

## 2015-03-01 DIAGNOSIS — Z01810 Encounter for preprocedural cardiovascular examination: Secondary | ICD-10-CM | POA: Diagnosis not present

## 2015-03-01 DIAGNOSIS — I10 Essential (primary) hypertension: Secondary | ICD-10-CM | POA: Diagnosis not present

## 2015-03-01 DIAGNOSIS — R0602 Shortness of breath: Secondary | ICD-10-CM | POA: Diagnosis not present

## 2015-04-12 DIAGNOSIS — M542 Cervicalgia: Secondary | ICD-10-CM | POA: Diagnosis not present

## 2015-04-12 DIAGNOSIS — M810 Age-related osteoporosis without current pathological fracture: Secondary | ICD-10-CM | POA: Diagnosis not present

## 2015-04-12 DIAGNOSIS — M545 Low back pain: Secondary | ICD-10-CM | POA: Diagnosis not present

## 2015-04-12 DIAGNOSIS — R140 Abdominal distension (gaseous): Secondary | ICD-10-CM | POA: Diagnosis not present

## 2015-04-12 DIAGNOSIS — I10 Essential (primary) hypertension: Secondary | ICD-10-CM | POA: Diagnosis not present

## 2015-04-12 DIAGNOSIS — R2681 Unsteadiness on feet: Secondary | ICD-10-CM | POA: Diagnosis not present

## 2015-04-12 DIAGNOSIS — Z6826 Body mass index (BMI) 26.0-26.9, adult: Secondary | ICD-10-CM | POA: Diagnosis not present

## 2015-04-13 ENCOUNTER — Other Ambulatory Visit: Payer: Self-pay | Admitting: Internal Medicine

## 2015-04-13 DIAGNOSIS — R140 Abdominal distension (gaseous): Secondary | ICD-10-CM

## 2015-04-14 ENCOUNTER — Other Ambulatory Visit: Payer: Medicare Other

## 2015-04-21 DIAGNOSIS — M4325 Fusion of spine, thoracolumbar region: Secondary | ICD-10-CM | POA: Diagnosis not present

## 2015-04-21 DIAGNOSIS — Z981 Arthrodesis status: Secondary | ICD-10-CM | POA: Diagnosis not present

## 2015-04-22 DIAGNOSIS — Z1231 Encounter for screening mammogram for malignant neoplasm of breast: Secondary | ICD-10-CM | POA: Diagnosis not present

## 2015-04-22 DIAGNOSIS — Z6829 Body mass index (BMI) 29.0-29.9, adult: Secondary | ICD-10-CM | POA: Diagnosis not present

## 2015-04-22 DIAGNOSIS — Z01419 Encounter for gynecological examination (general) (routine) without abnormal findings: Secondary | ICD-10-CM | POA: Diagnosis not present

## 2015-04-22 DIAGNOSIS — R61 Generalized hyperhidrosis: Secondary | ICD-10-CM | POA: Diagnosis not present

## 2015-05-13 DIAGNOSIS — M6281 Muscle weakness (generalized): Secondary | ICD-10-CM | POA: Diagnosis not present

## 2015-05-13 DIAGNOSIS — M79605 Pain in left leg: Secondary | ICD-10-CM | POA: Diagnosis not present

## 2015-05-13 DIAGNOSIS — R262 Difficulty in walking, not elsewhere classified: Secondary | ICD-10-CM | POA: Diagnosis not present

## 2015-05-13 DIAGNOSIS — R2681 Unsteadiness on feet: Secondary | ICD-10-CM | POA: Diagnosis not present

## 2015-05-17 DIAGNOSIS — M6281 Muscle weakness (generalized): Secondary | ICD-10-CM | POA: Diagnosis not present

## 2015-05-17 DIAGNOSIS — R262 Difficulty in walking, not elsewhere classified: Secondary | ICD-10-CM | POA: Diagnosis not present

## 2015-05-17 DIAGNOSIS — M79605 Pain in left leg: Secondary | ICD-10-CM | POA: Diagnosis not present

## 2015-05-17 DIAGNOSIS — R2681 Unsteadiness on feet: Secondary | ICD-10-CM | POA: Diagnosis not present

## 2015-05-19 DIAGNOSIS — R2681 Unsteadiness on feet: Secondary | ICD-10-CM | POA: Diagnosis not present

## 2015-05-19 DIAGNOSIS — M79605 Pain in left leg: Secondary | ICD-10-CM | POA: Diagnosis not present

## 2015-05-19 DIAGNOSIS — M6281 Muscle weakness (generalized): Secondary | ICD-10-CM | POA: Diagnosis not present

## 2015-05-19 DIAGNOSIS — R262 Difficulty in walking, not elsewhere classified: Secondary | ICD-10-CM | POA: Diagnosis not present

## 2015-05-24 DIAGNOSIS — R2681 Unsteadiness on feet: Secondary | ICD-10-CM | POA: Diagnosis not present

## 2015-05-24 DIAGNOSIS — R262 Difficulty in walking, not elsewhere classified: Secondary | ICD-10-CM | POA: Diagnosis not present

## 2015-05-24 DIAGNOSIS — M79605 Pain in left leg: Secondary | ICD-10-CM | POA: Diagnosis not present

## 2015-05-24 DIAGNOSIS — M6281 Muscle weakness (generalized): Secondary | ICD-10-CM | POA: Diagnosis not present

## 2015-05-30 DIAGNOSIS — R2681 Unsteadiness on feet: Secondary | ICD-10-CM | POA: Diagnosis not present

## 2015-05-30 DIAGNOSIS — M6281 Muscle weakness (generalized): Secondary | ICD-10-CM | POA: Diagnosis not present

## 2015-05-30 DIAGNOSIS — R262 Difficulty in walking, not elsewhere classified: Secondary | ICD-10-CM | POA: Diagnosis not present

## 2015-05-30 DIAGNOSIS — M79605 Pain in left leg: Secondary | ICD-10-CM | POA: Diagnosis not present

## 2015-06-01 DIAGNOSIS — R262 Difficulty in walking, not elsewhere classified: Secondary | ICD-10-CM | POA: Diagnosis not present

## 2015-06-01 DIAGNOSIS — R2681 Unsteadiness on feet: Secondary | ICD-10-CM | POA: Diagnosis not present

## 2015-06-01 DIAGNOSIS — M79605 Pain in left leg: Secondary | ICD-10-CM | POA: Diagnosis not present

## 2015-06-01 DIAGNOSIS — M6281 Muscle weakness (generalized): Secondary | ICD-10-CM | POA: Diagnosis not present

## 2015-06-06 DIAGNOSIS — R262 Difficulty in walking, not elsewhere classified: Secondary | ICD-10-CM | POA: Diagnosis not present

## 2015-06-06 DIAGNOSIS — M6281 Muscle weakness (generalized): Secondary | ICD-10-CM | POA: Diagnosis not present

## 2015-06-06 DIAGNOSIS — R2681 Unsteadiness on feet: Secondary | ICD-10-CM | POA: Diagnosis not present

## 2015-06-06 DIAGNOSIS — M79605 Pain in left leg: Secondary | ICD-10-CM | POA: Diagnosis not present

## 2015-06-08 DIAGNOSIS — M79605 Pain in left leg: Secondary | ICD-10-CM | POA: Diagnosis not present

## 2015-06-08 DIAGNOSIS — R262 Difficulty in walking, not elsewhere classified: Secondary | ICD-10-CM | POA: Diagnosis not present

## 2015-06-08 DIAGNOSIS — M6281 Muscle weakness (generalized): Secondary | ICD-10-CM | POA: Diagnosis not present

## 2015-06-08 DIAGNOSIS — R2681 Unsteadiness on feet: Secondary | ICD-10-CM | POA: Diagnosis not present

## 2015-06-13 DIAGNOSIS — R2681 Unsteadiness on feet: Secondary | ICD-10-CM | POA: Diagnosis not present

## 2015-06-13 DIAGNOSIS — M79605 Pain in left leg: Secondary | ICD-10-CM | POA: Diagnosis not present

## 2015-06-13 DIAGNOSIS — R262 Difficulty in walking, not elsewhere classified: Secondary | ICD-10-CM | POA: Diagnosis not present

## 2015-06-13 DIAGNOSIS — M6281 Muscle weakness (generalized): Secondary | ICD-10-CM | POA: Diagnosis not present

## 2015-06-15 DIAGNOSIS — R262 Difficulty in walking, not elsewhere classified: Secondary | ICD-10-CM | POA: Diagnosis not present

## 2015-06-15 DIAGNOSIS — M79605 Pain in left leg: Secondary | ICD-10-CM | POA: Diagnosis not present

## 2015-06-15 DIAGNOSIS — R2681 Unsteadiness on feet: Secondary | ICD-10-CM | POA: Diagnosis not present

## 2015-06-15 DIAGNOSIS — M6281 Muscle weakness (generalized): Secondary | ICD-10-CM | POA: Diagnosis not present

## 2015-06-20 DIAGNOSIS — M79605 Pain in left leg: Secondary | ICD-10-CM | POA: Diagnosis not present

## 2015-06-20 DIAGNOSIS — R262 Difficulty in walking, not elsewhere classified: Secondary | ICD-10-CM | POA: Diagnosis not present

## 2015-06-20 DIAGNOSIS — R2681 Unsteadiness on feet: Secondary | ICD-10-CM | POA: Diagnosis not present

## 2015-06-20 DIAGNOSIS — M6281 Muscle weakness (generalized): Secondary | ICD-10-CM | POA: Diagnosis not present

## 2015-06-22 DIAGNOSIS — M79605 Pain in left leg: Secondary | ICD-10-CM | POA: Diagnosis not present

## 2015-06-22 DIAGNOSIS — R2681 Unsteadiness on feet: Secondary | ICD-10-CM | POA: Diagnosis not present

## 2015-06-22 DIAGNOSIS — R262 Difficulty in walking, not elsewhere classified: Secondary | ICD-10-CM | POA: Diagnosis not present

## 2015-06-22 DIAGNOSIS — M6281 Muscle weakness (generalized): Secondary | ICD-10-CM | POA: Diagnosis not present

## 2015-06-27 DIAGNOSIS — R262 Difficulty in walking, not elsewhere classified: Secondary | ICD-10-CM | POA: Diagnosis not present

## 2015-06-27 DIAGNOSIS — M79605 Pain in left leg: Secondary | ICD-10-CM | POA: Diagnosis not present

## 2015-06-27 DIAGNOSIS — R2681 Unsteadiness on feet: Secondary | ICD-10-CM | POA: Diagnosis not present

## 2015-06-27 DIAGNOSIS — M6281 Muscle weakness (generalized): Secondary | ICD-10-CM | POA: Diagnosis not present

## 2015-06-28 DIAGNOSIS — M810 Age-related osteoporosis without current pathological fracture: Secondary | ICD-10-CM | POA: Diagnosis not present

## 2015-06-28 DIAGNOSIS — I10 Essential (primary) hypertension: Secondary | ICD-10-CM | POA: Diagnosis not present

## 2015-06-28 DIAGNOSIS — Z6827 Body mass index (BMI) 27.0-27.9, adult: Secondary | ICD-10-CM | POA: Diagnosis not present

## 2015-06-28 DIAGNOSIS — R5383 Other fatigue: Secondary | ICD-10-CM | POA: Diagnosis not present

## 2015-06-28 DIAGNOSIS — R2681 Unsteadiness on feet: Secondary | ICD-10-CM | POA: Diagnosis not present

## 2015-06-29 ENCOUNTER — Other Ambulatory Visit: Payer: Self-pay | Admitting: Internal Medicine

## 2015-06-29 DIAGNOSIS — R262 Difficulty in walking, not elsewhere classified: Secondary | ICD-10-CM | POA: Diagnosis not present

## 2015-06-29 DIAGNOSIS — M810 Age-related osteoporosis without current pathological fracture: Secondary | ICD-10-CM | POA: Diagnosis not present

## 2015-06-29 DIAGNOSIS — R2681 Unsteadiness on feet: Secondary | ICD-10-CM | POA: Diagnosis not present

## 2015-06-29 DIAGNOSIS — M79605 Pain in left leg: Secondary | ICD-10-CM | POA: Diagnosis not present

## 2015-06-29 DIAGNOSIS — M6281 Muscle weakness (generalized): Secondary | ICD-10-CM | POA: Diagnosis not present

## 2015-07-04 ENCOUNTER — Ambulatory Visit
Admission: RE | Admit: 2015-07-04 | Discharge: 2015-07-04 | Disposition: A | Discharge status: Home / Self Care | Payer: Medicare Other | Source: Ambulatory Visit | Attending: Internal Medicine | Admitting: Internal Medicine

## 2015-07-04 DIAGNOSIS — M4326 Fusion of spine, lumbar region: Secondary | ICD-10-CM | POA: Diagnosis not present

## 2015-07-04 DIAGNOSIS — M79605 Pain in left leg: Secondary | ICD-10-CM | POA: Diagnosis not present

## 2015-07-04 DIAGNOSIS — M5134 Other intervertebral disc degeneration, thoracic region: Secondary | ICD-10-CM | POA: Diagnosis not present

## 2015-07-04 DIAGNOSIS — M6281 Muscle weakness (generalized): Secondary | ICD-10-CM | POA: Diagnosis not present

## 2015-07-04 DIAGNOSIS — R262 Difficulty in walking, not elsewhere classified: Secondary | ICD-10-CM | POA: Diagnosis not present

## 2015-07-04 DIAGNOSIS — R2681 Unsteadiness on feet: Secondary | ICD-10-CM | POA: Diagnosis not present

## 2015-07-05 DIAGNOSIS — R768 Other specified abnormal immunological findings in serum: Secondary | ICD-10-CM | POA: Diagnosis present

## 2015-07-05 DIAGNOSIS — Z79899 Other long term (current) drug therapy: Secondary | ICD-10-CM | POA: Diagnosis not present

## 2015-07-05 DIAGNOSIS — J90 Pleural effusion, not elsewhere classified: Secondary | ICD-10-CM | POA: Diagnosis not present

## 2015-07-05 DIAGNOSIS — I10 Essential (primary) hypertension: Secondary | ICD-10-CM | POA: Diagnosis present

## 2015-07-05 DIAGNOSIS — Z981 Arthrodesis status: Secondary | ICD-10-CM | POA: Diagnosis not present

## 2015-07-05 DIAGNOSIS — M4802 Spinal stenosis, cervical region: Secondary | ICD-10-CM | POA: Diagnosis not present

## 2015-07-05 DIAGNOSIS — G8918 Other acute postprocedural pain: Secondary | ICD-10-CM | POA: Diagnosis not present

## 2015-07-05 DIAGNOSIS — F419 Anxiety disorder, unspecified: Secondary | ICD-10-CM | POA: Diagnosis present

## 2015-07-05 DIAGNOSIS — M4314 Spondylolisthesis, thoracic region: Secondary | ICD-10-CM | POA: Diagnosis not present

## 2015-07-05 DIAGNOSIS — R2689 Other abnormalities of gait and mobility: Secondary | ICD-10-CM | POA: Diagnosis present

## 2015-07-05 DIAGNOSIS — M963 Postlaminectomy kyphosis: Secondary | ICD-10-CM | POA: Diagnosis present

## 2015-07-05 DIAGNOSIS — G8929 Other chronic pain: Secondary | ICD-10-CM | POA: Diagnosis present

## 2015-07-05 DIAGNOSIS — R2681 Unsteadiness on feet: Secondary | ICD-10-CM | POA: Diagnosis not present

## 2015-07-05 DIAGNOSIS — M961 Postlaminectomy syndrome, not elsewhere classified: Secondary | ICD-10-CM | POA: Diagnosis not present

## 2015-07-05 DIAGNOSIS — Z01818 Encounter for other preprocedural examination: Secondary | ICD-10-CM | POA: Diagnosis not present

## 2015-07-05 DIAGNOSIS — F329 Major depressive disorder, single episode, unspecified: Secondary | ICD-10-CM | POA: Diagnosis present

## 2015-07-05 DIAGNOSIS — G4700 Insomnia, unspecified: Secondary | ICD-10-CM | POA: Diagnosis present

## 2015-07-05 DIAGNOSIS — M4804 Spinal stenosis, thoracic region: Secondary | ICD-10-CM | POA: Diagnosis not present

## 2015-07-05 DIAGNOSIS — M4712 Other spondylosis with myelopathy, cervical region: Secondary | ICD-10-CM | POA: Diagnosis not present

## 2015-07-05 DIAGNOSIS — M532X4 Spinal instabilities, thoracic region: Secondary | ICD-10-CM | POA: Diagnosis not present

## 2015-07-05 DIAGNOSIS — M960 Pseudarthrosis after fusion or arthrodesis: Secondary | ICD-10-CM | POA: Diagnosis not present

## 2015-07-05 DIAGNOSIS — M4120 Other idiopathic scoliosis, site unspecified: Secondary | ICD-10-CM | POA: Diagnosis not present

## 2015-08-04 ENCOUNTER — Inpatient Hospital Stay (HOSPITAL_COMMUNITY)
Admission: EM | Admit: 2015-08-04 | Discharge: 2015-08-06 | DRG: 352 | Disposition: A | Discharge status: Home / Self Care | Payer: Medicare Other | Attending: Internal Medicine | Admitting: Internal Medicine

## 2015-08-04 ENCOUNTER — Emergency Department (HOSPITAL_COMMUNITY): Payer: Medicare Other

## 2015-08-04 ENCOUNTER — Encounter (HOSPITAL_COMMUNITY): Payer: Self-pay | Admitting: Emergency Medicine

## 2015-08-04 DIAGNOSIS — Z9189 Other specified personal risk factors, not elsewhere classified: Secondary | ICD-10-CM | POA: Diagnosis not present

## 2015-08-04 DIAGNOSIS — Z981 Arthrodesis status: Secondary | ICD-10-CM | POA: Diagnosis not present

## 2015-08-04 DIAGNOSIS — F411 Generalized anxiety disorder: Secondary | ICD-10-CM | POA: Diagnosis present

## 2015-08-04 DIAGNOSIS — M549 Dorsalgia, unspecified: Secondary | ICD-10-CM | POA: Diagnosis present

## 2015-08-04 DIAGNOSIS — K529 Noninfective gastroenteritis and colitis, unspecified: Secondary | ICD-10-CM | POA: Diagnosis not present

## 2015-08-04 DIAGNOSIS — R109 Unspecified abdominal pain: Secondary | ICD-10-CM | POA: Diagnosis not present

## 2015-08-04 DIAGNOSIS — K4030 Unilateral inguinal hernia, with obstruction, without gangrene, not specified as recurrent: Secondary | ICD-10-CM | POA: Diagnosis present

## 2015-08-04 DIAGNOSIS — R000 Tachycardia, unspecified: Secondary | ICD-10-CM | POA: Diagnosis present

## 2015-08-04 DIAGNOSIS — K5909 Other constipation: Secondary | ICD-10-CM | POA: Diagnosis not present

## 2015-08-04 DIAGNOSIS — K4090 Unilateral inguinal hernia, without obstruction or gangrene, not specified as recurrent: Secondary | ICD-10-CM | POA: Diagnosis not present

## 2015-08-04 DIAGNOSIS — R0902 Hypoxemia: Secondary | ICD-10-CM | POA: Diagnosis present

## 2015-08-04 DIAGNOSIS — E785 Hyperlipidemia, unspecified: Secondary | ICD-10-CM | POA: Diagnosis present

## 2015-08-04 DIAGNOSIS — K5710 Diverticulosis of small intestine without perforation or abscess without bleeding: Secondary | ICD-10-CM | POA: Diagnosis not present

## 2015-08-04 DIAGNOSIS — T40605A Adverse effect of unspecified narcotics, initial encounter: Secondary | ICD-10-CM | POA: Diagnosis present

## 2015-08-04 DIAGNOSIS — K56609 Unspecified intestinal obstruction, unspecified as to partial versus complete obstruction: Secondary | ICD-10-CM | POA: Diagnosis present

## 2015-08-04 DIAGNOSIS — M4120 Other idiopathic scoliosis, site unspecified: Secondary | ICD-10-CM | POA: Diagnosis present

## 2015-08-04 DIAGNOSIS — G8929 Other chronic pain: Secondary | ICD-10-CM | POA: Diagnosis present

## 2015-08-04 DIAGNOSIS — K5660 Unspecified intestinal obstruction: Secondary | ICD-10-CM | POA: Diagnosis not present

## 2015-08-04 DIAGNOSIS — K5289 Other specified noninfective gastroenteritis and colitis: Secondary | ICD-10-CM | POA: Diagnosis present

## 2015-08-04 DIAGNOSIS — G8918 Other acute postprocedural pain: Secondary | ICD-10-CM | POA: Diagnosis not present

## 2015-08-04 DIAGNOSIS — K5669 Other intestinal obstruction: Secondary | ICD-10-CM | POA: Diagnosis not present

## 2015-08-04 DIAGNOSIS — K219 Gastro-esophageal reflux disease without esophagitis: Secondary | ICD-10-CM | POA: Diagnosis present

## 2015-08-04 DIAGNOSIS — K5790 Diverticulosis of intestine, part unspecified, without perforation or abscess without bleeding: Secondary | ICD-10-CM | POA: Insufficient documentation

## 2015-08-04 DIAGNOSIS — I10 Essential (primary) hypertension: Secondary | ICD-10-CM | POA: Diagnosis present

## 2015-08-04 DIAGNOSIS — K5900 Constipation, unspecified: Secondary | ICD-10-CM | POA: Diagnosis not present

## 2015-08-04 DIAGNOSIS — K5903 Drug induced constipation: Secondary | ICD-10-CM | POA: Diagnosis present

## 2015-08-04 LAB — CBC WITH DIFFERENTIAL/PLATELET
BASOS ABS: 0 10*3/uL (ref 0.0–0.1)
Basophils Relative: 0 %
Eosinophils Absolute: 0 10*3/uL (ref 0.0–0.7)
Eosinophils Relative: 0 %
HEMATOCRIT: 33.3 % — AB (ref 36.0–46.0)
Hemoglobin: 10.4 g/dL — ABNORMAL LOW (ref 12.0–15.0)
LYMPHS PCT: 15 %
Lymphs Abs: 1.9 10*3/uL (ref 0.7–4.0)
MCH: 28.6 pg (ref 26.0–34.0)
MCHC: 31.2 g/dL (ref 30.0–36.0)
MCV: 91.5 fL (ref 78.0–100.0)
Monocytes Absolute: 1 10*3/uL (ref 0.1–1.0)
Monocytes Relative: 8 %
NEUTROS ABS: 9.5 10*3/uL — AB (ref 1.7–7.7)
NEUTROS PCT: 77 %
PLATELETS: 608 10*3/uL — AB (ref 150–400)
RBC: 3.64 MIL/uL — AB (ref 3.87–5.11)
RDW: 13.3 % (ref 11.5–15.5)
WBC: 12.5 10*3/uL — AB (ref 4.0–10.5)

## 2015-08-04 LAB — COMPREHENSIVE METABOLIC PANEL
ALBUMIN: 4 g/dL (ref 3.5–5.0)
ALT: 10 U/L — ABNORMAL LOW (ref 14–54)
ANION GAP: 13 (ref 5–15)
AST: 21 U/L (ref 15–41)
Alkaline Phosphatase: 66 U/L (ref 38–126)
BILIRUBIN TOTAL: 0.8 mg/dL (ref 0.3–1.2)
BUN: 32 mg/dL — ABNORMAL HIGH (ref 6–20)
CO2: 28 mmol/L (ref 22–32)
Calcium: 9.2 mg/dL (ref 8.9–10.3)
Chloride: 94 mmol/L — ABNORMAL LOW (ref 101–111)
Creatinine, Ser: 0.99 mg/dL (ref 0.44–1.00)
GFR, EST NON AFRICAN AMERICAN: 57 mL/min — AB (ref >60)
Glucose, Bld: 105 mg/dL — ABNORMAL HIGH (ref 65–99)
POTASSIUM: 3.5 mmol/L (ref 3.5–5.1)
Sodium: 135 mmol/L (ref 135–145)
TOTAL PROTEIN: 8 g/dL (ref 6.5–8.1)

## 2015-08-04 LAB — URINALYSIS, ROUTINE W REFLEX MICROSCOPIC
GLUCOSE, UA: NEGATIVE mg/dL
Hgb urine dipstick: NEGATIVE
Ketones, ur: 15 mg/dL — AB
LEUKOCYTES UA: NEGATIVE
NITRITE: NEGATIVE
PH: 6.5 (ref 5.0–8.0)
PROTEIN: NEGATIVE mg/dL
Specific Gravity, Urine: >1.046 — ABNORMAL HIGH (ref 1.005–1.030)

## 2015-08-04 MED ORDER — ALUM & MAG HYDROXIDE-SIMETH 200-200-20 MG/5ML PO SUSP: 30.0000 mL | Freq: Four times a day (QID) | ORAL | Status: DC | PRN

## 2015-08-04 MED ORDER — METOPROLOL TARTRATE 12.5 MG HALF TABLET
12.5000 mg | ORAL_TABLET | Freq: Two times a day (BID) | ORAL | Status: DC | PRN
  Filled 2015-08-04: qty 1

## 2015-08-04 MED ORDER — ENOXAPARIN SODIUM 40 MG/0.4ML ~~LOC~~ SOLN
40.0000 mg | SUBCUTANEOUS | Status: DC
  Administered 2015-08-04 – 2015-08-05 (×2): 40 mg via SUBCUTANEOUS
  Filled 2015-08-04 (×3): qty 0.4

## 2015-08-04 MED ORDER — ACETAMINOPHEN 500 MG PO TABS
1000.0000 mg | ORAL_TABLET | Freq: Three times a day (TID) | ORAL | Status: DC | PRN
Start: 2015-08-04 — End: 2015-08-06

## 2015-08-04 MED ORDER — ONDANSETRON HCL 4 MG/2ML IJ SOLN: 4.0000 mg | Freq: Four times a day (QID) | INTRAMUSCULAR | Status: DC | PRN

## 2015-08-04 MED ORDER — MAGIC MOUTHWASH
15.0000 mL | Freq: Four times a day (QID) | ORAL | Status: DC | PRN
  Filled 2015-08-04: qty 15

## 2015-08-04 MED ORDER — SODIUM CHLORIDE 0.9 % IV SOLN
8.0000 mg | Freq: Four times a day (QID) | INTRAVENOUS | Status: DC | PRN
  Filled 2015-08-04: qty 4

## 2015-08-04 MED ORDER — ONDANSETRON HCL 4 MG/2ML IJ SOLN
4.0000 mg | Freq: Once | INTRAMUSCULAR | Status: AC
  Administered 2015-08-04: 4 mg via INTRAVENOUS
  Filled 2015-08-04: qty 2

## 2015-08-04 MED ORDER — LACTATED RINGERS IV BOLUS (SEPSIS): 1000.0000 mL | Freq: Three times a day (TID) | INTRAVENOUS | Status: DC | PRN

## 2015-08-04 MED ORDER — KCL IN DEXTROSE-NACL 40-5-0.45 MEQ/L-%-% IV SOLN
INTRAVENOUS | Status: DC
  Administered 2015-08-04: 1000 mL via INTRAVENOUS
  Filled 2015-08-04 (×2): qty 1000

## 2015-08-04 MED ORDER — LIDOCAINE HCL 2 % EX GEL
1.0000 "application " | Freq: Once | CUTANEOUS | Status: DC
  Filled 2015-08-04: qty 11

## 2015-08-04 MED ORDER — BISACODYL 10 MG RE SUPP: 10.0000 mg | Freq: Every day | RECTAL | Status: DC

## 2015-08-04 MED ORDER — HYDROMORPHONE HCL 1 MG/ML IJ SOLN: 1.0000 mg | INTRAMUSCULAR | Status: DC | PRN

## 2015-08-04 MED ORDER — FENTANYL 50 MCG/HR TD PT72
75.0000 ug | MEDICATED_PATCH | TRANSDERMAL | Status: DC
  Filled 2015-08-04: qty 1

## 2015-08-04 MED ORDER — SODIUM CHLORIDE 0.9 % IV BOLUS (SEPSIS)
1000.0000 mL | Freq: Once | INTRAVENOUS | Status: AC
  Administered 2015-08-04: 1000 mL via INTRAVENOUS

## 2015-08-04 MED ORDER — ACETAMINOPHEN 325 MG PO TABS: 325.0000 mg | ORAL_TABLET | Freq: Four times a day (QID) | ORAL | Status: DC | PRN

## 2015-08-04 MED ORDER — PROCHLORPERAZINE EDISYLATE 5 MG/ML IJ SOLN: 5.0000 mg | INTRAMUSCULAR | Status: DC | PRN

## 2015-08-04 MED ORDER — ALPRAZOLAM 0.25 MG PO TABS
0.2500 mg | ORAL_TABLET | Freq: Two times a day (BID) | ORAL | Status: DC | PRN
  Administered 2015-08-05: 0.25 mg via ORAL
  Filled 2015-08-04 (×2): qty 1

## 2015-08-04 MED ORDER — IOPAMIDOL (ISOVUE-300) INJECTION 61%
100.0000 mL | Freq: Once | INTRAVENOUS | Status: AC | PRN
  Administered 2015-08-04: 100 mL via INTRAVENOUS

## 2015-08-04 MED ORDER — METHOCARBAMOL 500 MG PO TABS
750.0000 mg | ORAL_TABLET | Freq: Three times a day (TID) | ORAL | Status: DC
  Administered 2015-08-05 – 2015-08-06 (×3): 750 mg via ORAL
  Filled 2015-08-04: qty 2
  Filled 2015-08-04: qty 1
  Filled 2015-08-04: qty 2
  Filled 2015-08-04 (×2): qty 1
  Filled 2015-08-04: qty 2
  Filled 2015-08-04: qty 1

## 2015-08-04 MED ORDER — ACETAMINOPHEN 650 MG RE SUPP: 650.0000 mg | Freq: Four times a day (QID) | RECTAL | Status: DC | PRN

## 2015-08-04 MED ORDER — MENTHOL 3 MG MT LOZG
1.0000 | LOZENGE | OROMUCOSAL | Status: DC | PRN
  Filled 2015-08-04: qty 9

## 2015-08-04 MED ORDER — LACTATED RINGERS IV BOLUS (SEPSIS)
1000.0000 mL | Freq: Once | INTRAVENOUS | Status: AC
  Administered 2015-08-04: 1000 mL via INTRAVENOUS

## 2015-08-04 MED ORDER — DIPHENHYDRAMINE HCL 50 MG/ML IJ SOLN
12.5000 mg | Freq: Four times a day (QID) | INTRAMUSCULAR | Status: DC | PRN
  Administered 2015-08-04: 12.5 mg via INTRAVENOUS
  Administered 2015-08-06: 25 mg via INTRAVENOUS
  Filled 2015-08-04 (×2): qty 1

## 2015-08-04 MED ORDER — ESCITALOPRAM OXALATE 10 MG PO TABS
10.0000 mg | ORAL_TABLET | Freq: Every day | ORAL | Status: DC
  Administered 2015-08-06: 10 mg via ORAL
  Filled 2015-08-04 (×2): qty 1

## 2015-08-04 MED ORDER — PHENOL 1.4 % MT LIQD
2.0000 | OROMUCOSAL | Status: DC | PRN
  Filled 2015-08-04: qty 177

## 2015-08-04 MED ORDER — LIP MEDEX EX OINT
1.0000 "application " | TOPICAL_OINTMENT | Freq: Two times a day (BID) | CUTANEOUS | Status: DC
  Administered 2015-08-04 – 2015-08-06 (×3): 1 via TOPICAL
  Filled 2015-08-04 (×2): qty 7

## 2015-08-04 MED ORDER — MORPHINE SULFATE (PF) 4 MG/ML IV SOLN
4.0000 mg | Freq: Once | INTRAVENOUS | Status: AC
Start: 2015-08-04 — End: 2015-08-04
  Administered 2015-08-04: 4 mg via INTRAVENOUS
  Filled 2015-08-04: qty 1

## 2015-08-04 MED ORDER — LORAZEPAM 2 MG/ML IJ SOLN: 0.5000 mg | Freq: Three times a day (TID) | INTRAMUSCULAR | Status: DC | PRN

## 2015-08-04 NOTE — ED Notes (Signed)
Patient transported to CT 

## 2015-08-04 NOTE — ED Provider Notes (Signed)
CSN: [id number]     Arrival date & time 08/04/15  1627 History   First MD Initiated Contact with Patient 08/04/15 1633     Chief Complaint  Patient presents with  . Abdominal Pain  . Constipation   PT IS A 70 YOU WF WHO HAD A CERVICAL FUSION A FEW WEEKS AGO.  SHE HAS BEEN ON NARCOTICS AND IS NOW CONSTIPATED.  SHE TRIED A FLEET ENEMA THIS AM AND HAS BEEN ON MIRALAX, SENOKOT, DULCOLAX WITHOUT A SUCCESSFUL BM SINCE Monday, June 12TH.  THE PT HAS NAUSEA AND HAS SOME ABDOMINAL CRAMPING.    (Consider location/radiation/quality/duration/timing/severity/associated sxs/prior Treatment) Patient is a 70 y.o. female presenting with abdominal pain and constipation. The history is provided by the patient and the spouse.  Abdominal Pain Pain location:  Generalized Pain quality: cramping   Pain radiates to:  Does not radiate Pain severity:  Moderate Onset quality:  Gradual Timing:  Constant Progression:  Unchanged Associated symptoms: constipation and nausea   Constipation Associated symptoms: abdominal pain and nausea     Past Medical History  Diagnosis Date  . Anxiety   . History of blood transfusion     related to surgery with own blood replacement  . Hyperlipidemia   . Hypertension   . Kyphoscoliosis   . Post laminectomy syndrome    Past Surgical History  Procedure Laterality Date  . Back surgery  2007    Dr Jordan Likes, T12-L5  . Diaphragmatic hernia repair  2007    Laparoscopic repair of diaphragmatic hernia with PROCEED  . Colonoscopy    . Posterior fusion spinal deformity  9.6.2016   Family History  Problem Relation Age of Onset  . Colon cancer Neg Hx   . Esophageal cancer Neg Hx   . Rectal cancer Neg Hx   . Stomach cancer Neg Hx    Social History  Substance Use Topics  . Smoking status: Never Smoker   . Smokeless tobacco: Never Used  . Alcohol Use: 1.8 - 2.4 oz/week    3-4 Glasses of wine per week   OB History    No data available     Review of Systems   Gastrointestinal: Positive for nausea, abdominal pain and constipation.  All other systems reviewed and are negative.     Allergies  Review of patient's allergies indicates no known allergies.  Home Medications   Prior to Admission medications   Medication Sig Start Date End Date Taking? Authorizing Provider  acetaminophen (TYLENOL) 500 MG tablet Take 1,000 mg by mouth every 8 (eight) hours as needed for moderate pain.   Yes Historical Provider, MD  ALPRAZolam (XANAX) 0.25 MG tablet Take 0.25 mg by mouth 2 (two) times daily as needed for anxiety or sleep.    Yes Historical Provider, MD  b complex vitamins tablet Take 1 tablet by mouth daily.   Yes Historical Provider, MD  escitalopram (LEXAPRO) 10 MG tablet Take 10 mg by mouth daily.   Yes Historical Provider, MD  fentaNYL (DURAGESIC - DOSED MCG/HR) 75 MCG/HR Place 75 mcg onto the skin every 3 (three) days.   Yes Historical Provider, MD  HYDROmorphone (DILAUDID) 2 MG tablet Take 2 mg by mouth every 6 (six) hours as needed for severe pain. 1-2 tabs PO Q 3 hours PRN   Yes Historical Provider, MD  methocarbamol (ROBAXIN) 750 MG tablet Take 750 mg by mouth 3 (three) times daily.   Yes Historical Provider, MD  naloxegol oxalate (MOVANTIK) 25 MG TABS tablet Take 25 mg  by mouth daily.   Yes Historical Provider, MD  Omega-3 Fatty Acids (FISH OIL) 1000 MG CAPS Take 1 capsule by mouth daily.   Yes Historical Provider, MD  ondansetron (ZOFRAN) 4 MG tablet Take 4 mg by mouth every 6 (six) hours as needed for nausea or vomiting (up to 7 days).   Yes Historical Provider, MD  pregabalin (LYRICA) 100 MG capsule Take 100 mg by mouth every 12 (twelve) hours.   Yes Historical Provider, MD  propranolol (INDERAL) 10 MG tablet Take 10 mg by mouth daily as needed (anxiety).   Yes Historical Provider, MD  Teriparatide, Recombinant, (FORTEO Nelson) Inject 1 Dose into the skin daily.   Yes Historical Provider, MD  valsartan-hydrochlorothiazide (DIOVAN-HCT) 160-12.5 MG  tablet Take 1 tablet by mouth daily.   Yes Historical Provider, MD   BP 124/75 mmHg  Pulse 95  Temp(Src) 98.2 F (36.8 C) (Oral)  Resp 20  Ht 5' (1.524 m)  Wt 130 lb (58.968 kg)  BMI 25.39 kg/m2  SpO2 100% Physical Exam  Constitutional: She is oriented to person, place, and time. She appears well-developed and well-nourished.  HENT:  Head: Normocephalic and atraumatic.  Right Ear: External ear normal.  Left Ear: External ear normal.  Nose: Nose normal.  Mouth/Throat: Oropharynx is clear and moist.  Eyes: Conjunctivae and EOM are normal. Pupils are equal, round, and reactive to light.  Neck: Normal range of motion. Neck supple.  Cardiovascular: Regular rhythm, normal heart sounds and intact distal pulses.  Tachycardia present.   Pulmonary/Chest: Effort normal and breath sounds normal.  Abdominal: Soft. Bowel sounds are decreased. A hernia is present. Hernia confirmed positive in the left inguinal area.  Musculoskeletal: Normal range of motion.  Neurological: She is alert and oriented to person, place, and time.  Skin: Skin is warm and dry.  Psychiatric: She has a normal mood and affect. Her behavior is normal. Judgment and thought content normal.  Nursing note and vitals reviewed.   ED Course  Procedures (including critical care time) Labs Review Labs Reviewed  COMPREHENSIVE METABOLIC PANEL - Abnormal; Notable for the following:    Chloride 94 (*)    Glucose, Bld 105 (*)    BUN 32 (*)    ALT 10 (*)    GFR calc non Af Amer 57 (*)    All other components within normal limits  CBC WITH DIFFERENTIAL/PLATELET - Abnormal; Notable for the following:    WBC 12.5 (*)    RBC 3.64 (*)    Hemoglobin 10.4 (*)    HCT 33.3 (*)    Platelets 608 (*)    Neutro Abs 9.5 (*)    All other components within normal limits  URINALYSIS, ROUTINE W REFLEX MICROSCOPIC (NOT AT [facility]) - Abnormal; Notable for the following:    Specific Gravity, Urine >1.046 (*)    Bilirubin Urine MODERATE (*)     Ketones, ur 15 (*)    All other components within normal limits    Imaging Review Ct Abdomen Pelvis W Contrast  08/04/2015  CLINICAL DATA:  Acute onset of intermittent generalized abdominal cramping, with constipation. Initial encounter. EXAM: CT ABDOMEN AND PELVIS WITH CONTRAST TECHNIQUE: Multidetector CT imaging of the abdomen and pelvis was performed using the standard protocol following bolus administration of intravenous contrast. CONTRAST:  ISOVUE-300 IOPAMIDOL (ISOVUE-300) INJECTION 61% COMPARISON:  MRI of the lumbar spine performed 07/04/2015, and CT of the lumbar spine performed 11/09/2014 FINDINGS: The visualized lung bases are clear. The stomach is partially filled  with fluid and is unremarkable in appearance. An anterior abdominal wall mesh is noted at the upper abdomen. There is dilatation of small bowel loops up to 4.5 cm in maximal diameter, to the level of herniation of a short segment of proximal to mid ileum into a small left inguinal hernia, with associated high-grade obstruction. The herniated loop is mildly dilated, with trace surrounding fluid. More distal small bowel is completely decompressed. A small amount of inflammation is noted tracking about the dilated proximal ileal loops. The liver and spleen are unremarkable in appearance. The gallbladder is within normal limits. The pancreatic duct is mildly distended, measuring up to 4 mm. Would correlate with pancreatic lab values. The adrenal glands are unremarkable. The kidneys are unremarkable in appearance. There is no evidence of hydronephrosis. No renal or ureteral stones are seen. No perinephric stranding is appreciated. No acute vascular abnormalities are seen. The appendix is decompressed and unremarkable in appearance. The colon is largely decompressed, with diffuse diverticulosis noted along the entirety of the colon. There is fatty infiltration at the ileocecal junction. There appears to be mild wall thickening along the  mid sigmoid colon, which could reflect mild acute diverticulitis or mild focal colitis. There is prominent enhancement about the rectum, raising concern for internal hemorrhoids. The bladder is mildly distended and grossly remarkable. The uterus is unremarkable in appearance. The ovaries are relatively symmetric. No suspicious adnexal masses are seen. No inguinal lymphadenopathy is seen. No acute osseous abnormalities are identified. Thoracolumbar spinal fusion hardware is noted, with underlying decompression. Diffuse underlying degenerative change is seen. There appears to be a chronic displaced fracture at the inferior edge of S1, with just over 1 cm of anterior and inferior displacement of S1 in relation to S2, with associated angulation; this is grossly stable from prior studies. IMPRESSION: 1. Relatively high-grade small bowel obstruction noted, with dilatation of small-bowel loops to 4.5 cm in maximal diameter. This reflects an obstructing small left inguinal hernia, containing a short segment of dilated proximal to mid ileum. Small amount of associated fluid noted within the hernia. Mild inflammation seen tracking about the dilated proximal ileal loops. Distal small bowel loops are decompressed. 2. Mild wall thickening along the mid sigmoid colon could reflect mild acute diverticulitis or mild focal colitis. 3. Prominent enhancement about the rectum raises concern for internal hemorrhoids. 4. Pancreatic duct is mildly distended, measuring up to 4 mm. Would correlate with pancreatic lab values. 5. 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 This is grossly stable from prior studies. Electronically Signed   By: Roanna Raider M.D.   On: 08/04/2015 19:01   Dg Abd Acute W/chest  08/04/2015  CLINICAL DATA:  Constipation-No BM X 4 days. Low abdominal pain that is now all over generalized  abdominal pain x 4 days. HTN, never smoker, kyphoscoliosis, post laminectomy syndrome, h/o hernia. Sx: back sx-2007, diaphragmatic hernia repair-2007 EXAM: DG ABDOMEN ACUTE W/ 1V CHEST COMPARISON:  None applicable FINDINGS: Posterior spinal fixation rods throughout the thoracolumbar spine. Heart size is upper normal. Lungs are clear. There is no free intraperitoneal air beneath diaphragm. Numerous dilated small bowel loops are present and contain air-fluid levels on the erect view. Colonic loops are not dilated. Paucity of large bowel gas. IMPRESSION: Partial or early small bowel obstruction. Electronically Signed   By: Norva Pavlov M.D.   On: 08/04/2015 17:19   I have  personally reviewed and evaluated these images and lab results as part of my medical decision-making.   EKG Interpretation None      MDM  PT D/W DR. GROSS (SURGERY) WHO WAS ABLE TO REDUCE THE HERNIA.  HE TOLD THE NURSE TO HOLD OFF ON THE NG AS HE REDUCED THE HERNIA.  HE WANTED HER ADMITTED TO MEDICINE DUE TO MULTIPLE MEDICAL PROBLEMS AND HYPOXIA FROM THE NARCOTICS.  I WILL SPEAK TO MEDICINE TO ADMIT. I SPOKE WITH DR. Lamar Laundry. SCHERTZ ([facility]) WHO WILL ADMIT PT. Final diagnoses:  Small bowel obstruction (HCC)  Left inguinal hernia     Jacalyn LefevreJulie Freada [place], MD 08/04/15 2047

## 2015-08-04 NOTE — ED Notes (Signed)
Patient transported to X-ray 

## 2015-08-04 NOTE — ED Notes (Signed)
Patient states that she recently had surgery and been on strong pain narcotics and believes is constipated.  Patient having intermiitent abd cramping pains and has had last BM was Monday which was hard and not a lot.

## 2015-08-04 NOTE — ED Notes (Signed)
Per MD, we will hold off on NG tube placement until further instruction.

## 2015-08-04 NOTE — H&P (Signed)
[facility] History and Physical  Deborah RanStephanie Spengler ZOX:[id number] DOB: Nov 10, 1945 DOA: 08/04/2015  Referring physician: Dr Particia NearingHaviland PCP: Garlan FillersPATERSON,DANIEL G, MD   Chief Complaint: Abd pain  HPI: Deborah Sellers is a 70 y.o. female with history of scoliosis and several spinal surgeries, last one was 4 wks ago at [facility]. Presenting with diffuse abd pain x 3 days, N/V on tues and Wed. CAme to ED where seen by surgery and found to have incarcerated inguinal hernia w prob bowel obstruction related. Reduced in ED by surgeon and subsequently had large BM,liquid mostly. Asked tosee for admission, feeling better now.    First back surgery was mid-thoracic to mid-lumbar w rods and screws,done in [place]. Next one was done at [facility] where they removed the rods and did a lot of fusion.  Then she had fusion up to T4. Then the last one recently was fusion from about T4 up to the mid cervical spine,this all per the husband. Only other medical issue is HTN, chron back pain.   [place], [place] 2 kids [place] in [place] and [place] in [place], [place]  Has a few grandkids.  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  Retired now. No etoh/ tob.     ROS  denies CP  no joint pain   no HA  no blurry vision  no rash  no diarrhea  no nausea/ vomiting  no dysuria  no difficulty voiding  no change in urine color    Past Medical History  Past Medical History  Diagnosis Date  . Anxiety   . History of blood transfusion     related to surgery with own blood replacement  . Hyperlipidemia   . Hypertension   . Kyphoscoliosis   . Post laminectomy syndrome    Past Surgical History  Past Surgical History  Procedure Laterality Date  . Back surgery  2007    Dr Jordan LikesPool, T12-L5  . Diaphragmatic hernia repair  2007    Laparoscopic repair of diaphragmatic hernia with PROCEED  . Colonoscopy    . Posterior fusion spinal deformity  9.6.2016   Family History  Family  History  Problem Relation Age of Onset  . Colon cancer Neg Hx   . Esophageal cancer Neg Hx   . Rectal cancer Neg Hx   . Stomach cancer Neg Hx    Social History  reports that she has never smoked. She has never used smokeless tobacco. She reports that she drinks about 1.8 - 2.4 oz of alcohol per week. She reports that she does not use illicit drugs. Allergies No Known Allergies Home medications Prior to Admission medications   Medication Sig Start Date End Date Taking? Authorizing Provider  acetaminophen (TYLENOL) 500 MG tablet Take 1,000 mg by mouth every 8 (eight) hours as needed for moderate pain.   Yes Historical Provider, MD  ALPRAZolam (XANAX) 0.25 MG tablet Take 0.25 mg by mouth 2 (two) times daily as needed for anxiety or sleep.    Yes Historical Provider, MD  b complex vitamins tablet Take 1 tablet by mouth daily.   Yes Historical Provider, MD  escitalopram (LEXAPRO) 10 MG tablet Take 10 mg by mouth daily.   Yes Historical Provider, MD  fentaNYL (DURAGESIC - DOSED MCG/HR) 75 MCG/HR Place 75 mcg onto the skin every 3 (three) days.   Yes Historical Provider, MD  HYDROmorphone (DILAUDID) 2 MG tablet Take 2 mg by mouth every 6 (six) hours as  needed for severe pain. 1-2 tabs PO Q 3 hours PRN   Yes Historical Provider, MD  methocarbamol (ROBAXIN) 750 MG tablet Take 750 mg by mouth 3 (three) times daily.   Yes Historical Provider, MD  naloxegol oxalate (MOVANTIK) 25 MG TABS tablet Take 25 mg by mouth daily.   Yes Historical Provider, MD  Omega-3 Fatty Acids (FISH OIL) 1000 MG CAPS Take 1 capsule by mouth daily.   Yes Historical Provider, MD  ondansetron (ZOFRAN) 4 MG tablet Take 4 mg by mouth every 6 (six) hours as needed for nausea or vomiting (up to 7 days).   Yes Historical Provider, MD  pregabalin (LYRICA) 100 MG capsule Take 100 mg by mouth every 12 (twelve) hours.   Yes Historical Provider, MD  propranolol (INDERAL) 10 MG tablet Take 10 mg by mouth daily as needed (anxiety).   Yes  Historical Provider, MD  Teriparatide, Recombinant, (FORTEO [place]) Inject 1 Dose into the skin daily.   Yes Historical Provider, MD  valsartan-hydrochlorothiazide (DIOVAN-HCT) 160-12.5 MG tablet Take 1 tablet by mouth daily.   Yes Historical Provider, MD   Liver Function Tests  Recent Labs Lab 08/04/15 1749  AST 21  ALT 10*  ALKPHOS 66  BILITOT 0.8  PROT 8.0  ALBUMIN 4.0   No results for input(s): LIPASE, AMYLASE in the last 168 hours. CBC  Recent Labs Lab 08/04/15 1749  WBC 12.5*  NEUTROABS 9.5*  HGB 10.4*  HCT 33.3*  MCV 91.5  PLT 608*   Basic Metabolic Panel  Recent Labs Lab 08/04/15 1749  NA 135  K 3.5  CL 94*  CO2 28  GLUCOSE 105*  BUN 32*  CREATININE 0.99  CALCIUM 9.2     Filed Vitals:   08/04/15 1910 08/04/15 1915 08/04/15 1930 08/04/15 1945  BP: 124/75     Pulse: 93 95 102 95  Temp: 98.2 F (36.8 C)     TempSrc: Oral     Resp: 20     Height:      Weight:      SpO2: 93% 89% 92% 100%   Exam: Gen alert, no distress, WDWN No rash, cyanosis or gangrene Sclera anicteric, throat clear and slightly dry  No jvd or bruits Chest clear bilat RRR no MRG Abd soft ntnd no mass or ascites +bs, no hernia noted now (was reduced by surg) GU defer MS no joint effusions or deformity Ext no LE edema / no wounds or ulcers Neuro is alert, Ox 3 , nf  Na 135  K 3.5  CUN 32  Creat 0.99  LFT"s ok  WBC 12k  Hb 10.4 UA SG 1.046,  Otherwise neg  CT abdomen >>  1. Relatively high-grade small bowel obstruction noted, with dilatation of small-bowel loops to 4.5 cm in maximal diameter. This reflects an obstructing small left inguinal hernia, containing a short segment of dilated proximal to mid ileum.... Distal small bowel loops are decompressed. 2. Mild wall thickening along the mid sigmoid colon could reflect mild acute diverticulitis or mild focal colitis. 3. Prominent enhancement about the rectum raises concern for internal hemorrhoids. 4. Pancreatic duct is  mildly distended, measuring up to 4 mm. Would correlate with pancreatic lab values. 5. Chronic displaced fracture at the inferior edge of S1  Abd xray acute w chest > normal chest.  Abd w partial / early SBO   Assessment: 1  Small bowel obstruction / incarcerated small left inguinal hernia, sp reduction in ED - improved clinically. Plan admit, IVF"s,  NPO, gen surg may plan on hernia repair while here.   2  HTN - on BB/ARB/ HCT at home; hold BP meds for now 3  Vol depletion - has had 2.5 L IVF in ED and looks better 4  Scoliosis/ multiple back surgeries/ spinal fusions - most recent at [facility] 4 wks ago, upper spine 5  Chron pain - cont duragesic patch, Robaxin  6  Hx opioid induced constipation - on Movantik but will hold for now   Plan - as above     Maree Krabbe [facility] Pager [phone]  Cell [phone]  If 7PM-7AM, please contact night-coverage [email or web address] Password [facility] 08/04/2015, 9:21 PM

## 2015-08-04 NOTE — ED Notes (Signed)
Pt passed medium sized soft stool post enema.

## 2015-08-04 NOTE — Consult Note (Signed)
[address] [place], [place] [address] Phone: [phone] FAX: [place]  [birth year] [id number]  CARE TEAM:  PCP: Donnajean Lopes, MD  Outpatient Care Team: Patient Care Team: Leanna Battles, MD as PCP - General (Internal Medicine) Earnie Larsson, MD as Consulting Physician (Neurosurgery) Jerene Bears, MD as Consulting Physician (Gastroenterology) Gloris Manchester, MD as Consulting Physician (Neurosurgery)  Inpatient Treatment Team: Treatment Team: Attending Provider: Isla Pence, MD; Registered Nurse: Trixie Deis, RN; Registered Nurse: Haydee Salter, RN; Consulting Physician: Nolon Nations, MD  This patient is a 70 y.o.female who presents today for surgical evaluation at the request of Dr Gilford Raid.   Reason for evaluation: Small bowel obstruction with incarcerated inguinal hernia.  Hypoxia.  Nausea and vomiting.  Pleasant elderly woman.  Here today with her husband.  History of severe scoliosis.  Has had numerous back surgeries.  Required revisional back surgery several times in the past year at [facility]  Most recently a month ago.  Often gets severe ileus is and constipation.  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  Patient was struggling with worsening of abdominal pain and discomfort.  Felt constipated.  Try to restart bowel regimen.  Began to have vomiting with solid food four days ago.  Try to transition to liquids.  Worsening vomiting for a few days.  Then felt better yesterday.  Felt worse today.  Tried a Fleet enema at home.  Not successful.  Tried MiraLAX, Senokot.  Tried Dulcolax.  Has not had a bowel movement for four days.  No flatus in 3 days.  Worsening crampy abdominal pain.  Comes in the emergency room.  No personal nor family history of GI/colon cancer, inflammatory bowel disease, irritable bowel syndrome, allergy such as  Celiac Sprue, dietary/dairy problems, colitis, ulcers nor gastritis.  Known to have moderate pandiverticulosis on a colonoscopy a few years ago by Dr. Zenovia Jarred.  Had a diaphragmatic hernia that required laparoscopic repair 10 years ago.  No recent sick contacts/gastroenteritis.  No travel outside the country.  No changes in diet.  No dysphagia to solids or liquids.  No significant heartburn or reflux.  No hematochezia, hematemesis, coffee ground emesis.  No evidence of prior gastric/peptic ulceration.  Because the pain worsened, she came in today.  Husband at bedside.  Patient recalls being told she had a left internal hernia.  Plan was to consider elective repair once her back fusions were done.  That discussion was years ago though.    Past Medical History  Diagnosis Date  . Anxiety   . History of blood transfusion     related to surgery with own blood replacement  . Hyperlipidemia   . Hypertension   . Kyphoscoliosis   . Post laminectomy syndrome     Past Surgical History  Procedure Laterality Date  . Back surgery  2007    Dr Annette Stable, T12-L5  . Diaphragmatic hernia repair  2007    Laparoscopic repair of diaphragmatic hernia with PROCEED  . Colonoscopy    . Posterior fusion spinal deformity  9.6.2016    Social History   Social History  . Marital Status: Married    Spouse Name: N/A  . Number of Children: N/A  . Years of Education: N/A   Occupational History  . Not on file.   Social History Main Topics  . Smoking status: Never Smoker   . Smokeless tobacco:  Never Used  . Alcohol Use: 1.8 - 2.4 oz/week    3-4 Glasses of wine per week  . Drug Use: No  . Sexual Activity: Not on file   Other Topics Concern  . Not on file   Social History Narrative    Family History  Problem Relation Age of Onset  . Colon cancer Neg Hx   . Esophageal cancer Neg Hx   . Rectal cancer Neg Hx   . Stomach cancer Neg Hx     Current Facility-Administered Medications  Medication Dose  Route Frequency Provider Last Rate Last Dose  . acetaminophen (TYLENOL) suppository 650 mg  650 mg Rectal Q6H PRN Michael Boston, MD      . acetaminophen (TYLENOL) tablet 325-650 mg  325-650 mg Oral Q6H PRN Michael Boston, MD      . alum & mag hydroxide-simeth (MAALOX/MYLANTA) 200-200-20 MG/5ML suspension 30 mL  30 mL Oral Q6H PRN Michael Boston, MD      . bisacodyl (DULCOLAX) suppository 10 mg  10 mg Rectal Daily Michael Boston, MD      . dextrose 5 % and 0.45 % NaCl with KCl 40 mEq/L infusion   Intravenous Continuous Michael Boston, MD      . diphenhydrAMINE (BENADRYL) injection 12.5-25 mg  12.5-25 mg Intravenous Q6H PRN Michael Boston, MD      . lactated ringers bolus 1,000 mL  1,000 mL Intravenous Once Michael Boston, MD      . lactated ringers bolus 1,000 mL  1,000 mL Intravenous Q8H PRN Michael Boston, MD      . lactated ringers bolus 1,000 mL  1,000 mL Intravenous Q8H PRN Michael Boston, MD      . lidocaine (XYLOCAINE) 2 % jelly 1 application  1 application Other Once Isla Pence, MD   Stopped at 08/04/15 2002  . lip balm (CARMEX) ointment 1 application  1 application Topical BID Michael Boston, MD      . LORazepam (ATIVAN) injection 0.5-1 mg  0.5-1 mg Intravenous Q8H PRN Michael Boston, MD      . magic mouthwash  15 mL Oral QID PRN Michael Boston, MD      . menthol-cetylpyridinium (CEPACOL) lozenge 3 mg  1 lozenge Oral PRN Michael Boston, MD      . metoprolol tartrate (LOPRESSOR) tablet 12.5 mg  12.5 mg Oral Q12H PRN Michael Boston, MD      . ondansetron [facility]) injection 4 mg  4 mg Intravenous Q6H PRN Michael Boston, MD       Or  . ondansetron (ZOFRAN) 8 mg in sodium chloride 0.9 % 50 mL IVPB  8 mg Intravenous Q6H PRN Michael Boston, MD      . phenol (CHLORASEPTIC) mouth spray 2 spray  2 spray Mouth/Throat PRN Michael Boston, MD      . prochlorperazine (COMPAZINE) injection 5-10 mg  5-10 mg Intravenous Q4H PRN Michael Boston, MD       Current Outpatient Prescriptions  Medication Sig Dispense Refill  . acetaminophen  (TYLENOL) 500 MG tablet Take 1,000 mg by mouth every 8 (eight) hours as needed for moderate pain.    Marland Kitchen ALPRAZolam (XANAX) 0.25 MG tablet Take 0.25 mg by mouth 2 (two) times daily as needed for anxiety or sleep.     Marland Kitchen b complex vitamins tablet Take 1 tablet by mouth daily.    Marland Kitchen escitalopram (LEXAPRO) 10 MG tablet Take 10 mg by mouth daily.    . fentaNYL (DURAGESIC - DOSED MCG/HR) 75 MCG/HR Place 75 mcg  onto the skin every 3 (three) days.    Marland Kitchen HYDROmorphone (DILAUDID) 2 MG tablet Take 2 mg by mouth every 6 (six) hours as needed for severe pain. 1-2 tabs PO Q 3 hours PRN    . methocarbamol (ROBAXIN) 750 MG tablet Take 750 mg by mouth 3 (three) times daily.    . naloxegol oxalate (MOVANTIK) 25 MG TABS tablet Take 25 mg by mouth daily.    . Omega-3 Fatty Acids (FISH OIL) 1000 MG CAPS Take 1 capsule by mouth daily.    . ondansetron (ZOFRAN) 4 MG tablet Take 4 mg by mouth every 6 (six) hours as needed for nausea or vomiting (up to 7 days).    . pregabalin (LYRICA) 100 MG capsule Take 100 mg by mouth every 12 (twelve) hours.    . propranolol (INDERAL) 10 MG tablet Take 10 mg by mouth daily as needed (anxiety).    . Teriparatide, Recombinant, (FORTEO [place]) Inject 1 Dose into the skin daily.    . valsartan-hydrochlorothiazide (DIOVAN-HCT) 160-12.5 MG tablet Take 1 tablet by mouth daily.       No Known Allergies  ROS: Constitutional:  No fevers, chills, sweats.  Weight stable Eyes:  No vision changes, No discharge HENT:  No sore throats, nasal drainage Lymph: No neck swelling, No bruising easily Pulmonary:  No cough, productive sputum CV: No orthopnea, PND  Patient walks 10 minutes with a walker for balance with difficulty.  No exertional chest/neck/shoulder/arm pain. GI: No personal nor family history of GI/colon cancer, inflammatory bowel disease, irritable bowel syndrome, allergy such as Celiac Sprue, dietary/dairy problems, colitis, ulcers nor gastritis.  No recent sick contacts/gastroenteritis.  No  travel outside the country.  No changes in diet. Renal: No UTIs, No hematuria Genital:  No drainage, bleeding, masses Musculoskeletal: No severe joint pain.  Good ROM major joints Skin:  No sores or lesions.  No rashes Heme/Lymph:  No easy bleeding.  No swollen lymph nodes Neuro: No focal weakness/numbness.  No seizures Psych: No suicidal ideation.  No hallucinations  BP 124/75 mmHg  Pulse 95  Temp(Src) 98.2 F (36.8 C) (Oral)  Resp 20  Ht 5' (1.524 m)  Wt 58.968 kg (130 lb)  BMI 25.39 kg/m2  SpO2 100%  Physical Exam: General: Pt awake/alert/oriented x4 in moderate acute distress Eyes: PERRL, normal EOM. Sclera nonicteric Neuro: CN II-XII intact w/o focal sensory/motor deficits. Lymph: No head/neck/groin lymphadenopathy Psych:  No delerium/psychosis/paranoia HENT: Normocephalic, Mucus membranes moist.  No thrush Neck: Supple, No tracheal deviation Chest: No pain.  Decreased breath sounds.  No definite crackles or rhonchi.  Fair respiratory excursion. CV:  Pulses intact.  Regular rhythm.  Initially was tachycardic but her rate in the 80s now  Abdomen: Soft, Moderately distended.  Left paramedian infraumbilical abdominal incision.  No hernia.  No umbilical hernia.  No diastases.  No guarding.  No peritonitis  Genital: Normal external female genitalia.  5 cm mass in left groin sensitive but no fluctuance or gangrene.  Eventually I was able to reduce out down this incarcerated left inguinal hernia.  No right inguinal hernia.  No lymphadenopathy.  Vaginal bleeding or discharge  Ext:  SCDs BLE.  No significant edema.  No cyanosis Skin: No petechiae / purpurea.  No major sores Musculoskeletal: No severe joint pain.  Not moving neck or spine much consistent with fusion.  Otherwise Good ROM major joints   Results:   Labs: Results for orders placed or performed during the hospital encounter of 08/04/15 (from the  past 48 hour(s))  Comprehensive metabolic panel     Status: Abnormal    Collection Time: 08/04/15  5:49 PM  Result Value Ref Range   Sodium 135 135 - 145 mmol/L   Potassium 3.5 3.5 - 5.1 mmol/L   Chloride 94 (L) 101 - 111 mmol/L   CO2 28 22 - 32 mmol/L   Glucose, Bld 105 (H) 65 - 99 mg/dL   BUN 32 (H) 6 - 20 mg/dL   Creatinine, Ser 0.99 0.44 - 1.00 mg/dL   Calcium 9.2 8.9 - 10.3 mg/dL   Total Protein 8.0 6.5 - 8.1 g/dL   Albumin 4.0 3.5 - 5.0 g/dL   AST 21 15 - 41 U/L   ALT 10 (L) 14 - 54 U/L   Alkaline Phosphatase 66 38 - 126 U/L   Total Bilirubin 0.8 0.3 - 1.2 mg/dL   GFR calc non Af Amer 57 (L) >60 mL/min   GFR calc Af Amer >60 >60 mL/min    Comment: (NOTE) The eGFR has been calculated using the CKD EPI equation. This calculation has not been validated in all clinical situations. eGFR's persistently <60 mL/min signify possible Chronic Kidney Disease.    Anion gap 13 5 - 15  CBC with Differential     Status: Abnormal   Collection Time: 08/04/15  5:49 PM  Result Value Ref Range   WBC 12.5 (H) 4.0 - 10.5 K/uL   RBC 3.64 (L) 3.87 - 5.11 MIL/uL   Hemoglobin 10.4 (L) 12.0 - 15.0 g/dL   HCT 33.3 (L) 36.0 - 46.0 %   MCV 91.5 78.0 - 100.0 fL   MCH 28.6 26.0 - 34.0 pg   MCHC 31.2 30.0 - 36.0 g/dL   RDW 13.3 11.5 - 15.5 %   Platelets 608 (H) 150 - 400 K/uL   Neutrophils Relative % 77 %   Neutro Abs 9.5 (H) 1.7 - 7.7 K/uL   Lymphocytes Relative 15 %   Lymphs Abs 1.9 0.7 - 4.0 K/uL   Monocytes Relative 8 %   Monocytes Absolute 1.0 0.1 - 1.0 K/uL   Eosinophils Relative 0 %   Eosinophils Absolute 0.0 0.0 - 0.7 K/uL   Basophils Relative 0 %   Basophils Absolute 0.0 0.0 - 0.1 K/uL  Urinalysis, Routine w reflex microscopic     Status: Abnormal   Collection Time: 08/04/15  7:30 PM  Result Value Ref Range   Color, Urine YELLOW YELLOW   APPearance CLEAR CLEAR   Specific Gravity, Urine >1.046 (H) 1.005 - 1.030   pH 6.5 5.0 - 8.0   Glucose, UA NEGATIVE NEGATIVE mg/dL   Hgb urine dipstick NEGATIVE NEGATIVE   Bilirubin Urine MODERATE (A) NEGATIVE    Ketones, ur 15 (A) NEGATIVE mg/dL   Protein, ur NEGATIVE NEGATIVE mg/dL   Nitrite NEGATIVE NEGATIVE   Leukocytes, UA NEGATIVE NEGATIVE    Comment: MICROSCOPIC NOT DONE ON URINES WITH NEGATIVE PROTEIN, BLOOD, LEUKOCYTES, NITRITE, OR GLUCOSE <1000 mg/dL.    Imaging / Studies: Ct Abdomen Pelvis W Contrast  08/04/2015  CLINICAL DATA:  Acute onset of intermittent generalized abdominal cramping, with constipation. Initial encounter. EXAM: CT ABDOMEN AND PELVIS WITH CONTRAST TECHNIQUE: Multidetector CT imaging of the abdomen and pelvis was performed using the standard protocol following bolus administration of intravenous contrast. CONTRAST:  126m ISOVUE-300 IOPAMIDOL (ISOVUE-300) INJECTION 61% COMPARISON:  MRI of the lumbar spine performed 07/04/2015, and CT of the lumbar spine performed 11/09/2014 FINDINGS: The visualized lung bases are clear. The stomach is partially  filled with fluid and is unremarkable in appearance. An anterior abdominal wall mesh is noted at the upper abdomen. There is dilatation of small bowel loops up to 4.5 cm in maximal diameter, to the level of herniation of a short segment of proximal to mid ileum into a small left inguinal hernia, with associated high-grade obstruction. The herniated loop is mildly dilated, with trace surrounding fluid. More distal small bowel is completely decompressed. A small amount of inflammation is noted tracking about the dilated proximal ileal loops. The liver and spleen are unremarkable in appearance. The gallbladder is within normal limits. The pancreatic duct is mildly distended, measuring up to 4 mm. Would correlate with pancreatic lab values. The adrenal glands are unremarkable. The kidneys are unremarkable in appearance. There is no evidence of hydronephrosis. No renal or ureteral stones are seen. No perinephric stranding is appreciated. No acute vascular abnormalities are seen. The appendix is decompressed and unremarkable in appearance. The colon is  largely decompressed, with diffuse diverticulosis noted along the entirety of the colon. There is fatty infiltration at the ileocecal junction. There appears to be mild wall thickening along the mid sigmoid colon, which could reflect mild acute diverticulitis or mild focal colitis. There is prominent enhancement about the rectum, raising concern for internal hemorrhoids. The bladder is mildly distended and grossly remarkable. The uterus is unremarkable in appearance. The ovaries are relatively symmetric. No suspicious adnexal masses are seen. No inguinal lymphadenopathy is seen. No acute osseous abnormalities are identified. Thoracolumbar spinal fusion hardware is noted, with underlying decompression. Diffuse underlying degenerative change is seen. There appears to be a chronic displaced fracture at the inferior edge of S1, with just over 1 cm of anterior and inferior displacement of S1 in relation to S2, with associated angulation; this is grossly stable from prior studies. IMPRESSION: 1. Relatively high-grade small bowel obstruction noted, with dilatation of small-bowel loops to 4.5 cm in maximal diameter. This reflects an obstructing small left inguinal hernia, containing a short segment of dilated proximal to mid ileum. Small amount of associated fluid noted within the hernia. Mild inflammation seen tracking about the dilated proximal ileal loops. Distal small bowel loops are decompressed. 2. Mild wall thickening along the mid sigmoid colon could reflect mild acute diverticulitis or mild focal colitis. 3. Prominent enhancement about the rectum raises concern for internal hemorrhoids. 4. Pancreatic duct is mildly distended, measuring up to 4 mm. Would correlate with pancreatic lab values. 5. 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 This is grossly stable from prior studies. Electronically  Signed   By: Garald Balding M.D.   On: 08/04/2015 19:01   Dg Abd Acute W/chest  08/04/2015  CLINICAL DATA:  Constipation-No BM X 4 days. Low abdominal pain that is now all over generalized abdominal pain x 4 days. HTN, never smoker, kyphoscoliosis, post laminectomy syndrome, h/o hernia. Sx: back sx-2007, diaphragmatic hernia repair-2007 EXAM: DG ABDOMEN ACUTE W/ 1V CHEST COMPARISON:  None applicable FINDINGS: Posterior spinal fixation rods throughout the thoracolumbar spine. Heart size is upper normal. Lungs are clear. There is no free intraperitoneal air beneath diaphragm. Numerous dilated small bowel loops are present and contain air-fluid levels on the erect view. Colonic loops are not dilated. Paucity of large bowel gas. IMPRESSION: Partial or early small bowel obstruction. Electronically Signed   By: Nolon Nations M.D.   On: 08/04/2015 17:19  Medications / Allergies: per chart  Antibiotics: Anti-infectives    None      Assessment  Deborah Sellers  70 y.o. female     Procedure(s): HERNIA REPAIR INGUINAL ADULT  Problem List:  Principal Problem:   Hypoxia Active Problems:   Diverticulosis of entire colon   Essential (primary) hypertension   H/O anxiety state   Idiopathic scoliosis and kyphoscoliosis   Incarcerated left inguinal hernia   Tachycardia   SBO (small bowel obstruction) (HCC)   Chronic constipation   Diversion colitis   Left inguinal hernia with incarceration and probable transition point small bowel obstruction.  Now reduced.  Hypoxia.  No definite aspiration pneumonia but a concern with all her emesis.  Plan:  -Medical stabilization.    -Rehydration.    -Hypoxia.  Maybe oversedated.  With recent emesis, I am concerned that she could have aspirated.  Preliminary chest x-ray looks okay right now.  We will repeat in the morning and follow.  See what medicine thinks.  See if she needs to be treated for pneumonitis/pneumonia or follow clinically.  Would hold  off on surgery until hypoxia resolved.  -Nothing by mouth.  Ice chips only to start.  Then retry by mouth if obstruction/obstipation resolved Nasogastric tube decompression if she vomits again.  Hold off for now since the transition zone seen to be in the left lower quadrant.  If she has persistent obstructive symptoms, she may have a factor V adhesions as the main source and not really the hernia.  I am skeptical that that.  GERD feels better with it reduced.  She would deftly benefit from inguinal hernia repair.  Main stink would be medical stabilization and then surgery this admission if her ileus resolves.  It could be as soon as tomorrow if her hypoxia resolves since she is medically cleared.  Otherwise may need to wait a few days.  The anatomy & physiology of the abdominal wall and pelvic floor was discussed.  The pathophysiology of hernias in the inguinal and pelvic region was discussed.  Natural history risks such as progressive enlargement, pain, incarceration, and strangulation was discussed.   Contributors to complications such as smoking, obesity, diabetes, prior surgery, etc were discussed.    I feel the risks of no intervention will lead to serious problems that outweigh the operative risks; therefore, I recommended surgery to reduce and repair the hernia.  I explained laparoscopic techniques with possible need for an open approach.  I noted usual use of mesh to patch and/or buttress hernia repair  Risks such as bleeding, infection, abscess, need for further treatment, injury to other organs, need for repair of tissues / organs, stroke, heart attack, death, and other risks were discussed.  I noted a good likelihood this will help address the problem.   Goals of post-operative recovery were discussed as well.  Possibility that this will not correct all symptoms was explained.  I stressed the importance of low-impact activity, aggressive pain control, avoiding constipation, & not pushing  through pain to minimize risk of post-operative chronic pain or injury. Possibility of reherniation was discussed.  We will work to minimize complications.     An educational handout further explaining the pathology & treatment options was given as well.  Questions were answered.  The patient expresses understanding & wishes to proceed with surgery.  The partner, Dr. Lucia Gaskins, will assume care in the morning and help determine when safe to intervene with surgical repair depending on medical clearance and the patient's  course.    Possible colitis/diverticulitis.  She does not really have pain on that side.  She is a history diverticulosis but no history of diverticulitis.  I am a little skeptical of this time.  Seems more like decompressed colon from bringing diverted from the proximal small bowel obstruction.  Follow clinically.  If her white count does not resolve, I may need to revisit and rescan.  May need to put on IV antibiotics.  Hold for now.  -VTE prophylaxis- SCDs, etc -mobilize as tolerated to help recovery    Adin Hector, M.D., F.A.C.S. Gastrointestinal and Minimally Invasive Surgery [facility] [address], [place], [place] [address] [phone] Main / Paging   08/04/2015  Note: Portions of this report may have been transcribed using voice recognition software. Every effort was made to ensure accuracy; however, inadvertent computerized transcription errors may be present.   Any transcriptional errors that result from this process are unintentional.

## 2015-08-04 NOTE — ED Notes (Signed)
MD at bedside. 

## 2015-08-05 ENCOUNTER — Encounter (HOSPITAL_COMMUNITY): Payer: Self-pay | Admitting: Certified Registered"

## 2015-08-05 ENCOUNTER — Inpatient Hospital Stay (HOSPITAL_COMMUNITY): Payer: Medicare Other | Admitting: Registered Nurse

## 2015-08-05 ENCOUNTER — Encounter (HOSPITAL_COMMUNITY)
Admission: EM | Disposition: A | Discharge status: Home / Self Care | Payer: Self-pay | Source: Home / Self Care | Attending: Internal Medicine

## 2015-08-05 DIAGNOSIS — K5900 Constipation, unspecified: Secondary | ICD-10-CM

## 2015-08-05 DIAGNOSIS — M549 Dorsalgia, unspecified: Secondary | ICD-10-CM

## 2015-08-05 DIAGNOSIS — K5669 Other intestinal obstruction: Secondary | ICD-10-CM

## 2015-08-05 DIAGNOSIS — K4030 Unilateral inguinal hernia, with obstruction, without gangrene, not specified as recurrent: Principal | ICD-10-CM

## 2015-08-05 DIAGNOSIS — G8929 Other chronic pain: Secondary | ICD-10-CM

## 2015-08-05 DIAGNOSIS — I10 Essential (primary) hypertension: Secondary | ICD-10-CM

## 2015-08-05 HISTORY — PX: INSERTION OF MESH: SHX5868

## 2015-08-05 HISTORY — PX: INGUINAL HERNIA REPAIR: SHX194

## 2015-08-05 LAB — CBC
HEMATOCRIT: 30.7 % — AB (ref 36.0–46.0)
HEMOGLOBIN: 9.5 g/dL — AB (ref 12.0–15.0)
MCH: 27.9 pg (ref 26.0–34.0)
MCHC: 30.9 g/dL (ref 30.0–36.0)
MCV: 90 fL (ref 78.0–100.0)
Platelets: 518 10*3/uL — ABNORMAL HIGH (ref 150–400)
RBC: 3.41 MIL/uL — ABNORMAL LOW (ref 3.87–5.11)
RDW: 13 % (ref 11.5–15.5)
WBC: 11.9 10*3/uL — ABNORMAL HIGH (ref 4.0–10.5)

## 2015-08-05 LAB — BASIC METABOLIC PANEL
ANION GAP: 7 (ref 5–15)
BUN: 21 mg/dL — ABNORMAL HIGH (ref 6–20)
CALCIUM: 8.7 mg/dL — AB (ref 8.9–10.3)
CHLORIDE: 103 mmol/L (ref 101–111)
CO2: 27 mmol/L (ref 22–32)
CREATININE: 0.72 mg/dL (ref 0.44–1.00)
GFR calc Af Amer: >60 mL/min (ref >60)
GFR calc non Af Amer: >60 mL/min (ref >60)
GLUCOSE: 105 mg/dL — AB (ref 65–99)
Potassium: 3.7 mmol/L (ref 3.5–5.1)
Sodium: 137 mmol/L (ref 135–145)

## 2015-08-05 LAB — SURGICAL PCR SCREEN
MRSA, PCR: NEGATIVE
Staphylococcus aureus: NEGATIVE

## 2015-08-05 SURGERY — REPAIR, HERNIA, INGUINAL, ADULT
Anesthesia: Regional | Site: Groin | Laterality: Left

## 2015-08-05 MED ORDER — PROPOFOL 10 MG/ML IV BOLUS
INTRAVENOUS | Status: AC
  Filled 2015-08-05: qty 20

## 2015-08-05 MED ORDER — BUPIVACAINE LIPOSOME 1.3 % IJ SUSP
20.0000 mL | Freq: Once | INTRAMUSCULAR | Status: AC
  Administered 2015-08-05: 10 mL
  Filled 2015-08-05: qty 20

## 2015-08-05 MED ORDER — FENTANYL CITRATE (PF) 250 MCG/5ML IJ SOLN
INTRAMUSCULAR | Status: DC | PRN
  Administered 2015-08-05 (×2): 50 ug via INTRAVENOUS
  Administered 2015-08-05: 150 ug via INTRAVENOUS

## 2015-08-05 MED ORDER — EPHEDRINE SULFATE 50 MG/ML IJ SOLN
INTRAMUSCULAR | Status: AC
  Filled 2015-08-05: qty 1

## 2015-08-05 MED ORDER — LACTATED RINGERS IV SOLN
INTRAVENOUS | Status: DC | PRN
  Administered 2015-08-05 (×2): via INTRAVENOUS

## 2015-08-05 MED ORDER — BUPIVACAINE-EPINEPHRINE (PF) 0.5% -1:200000 IJ SOLN
INTRAMUSCULAR | Status: AC
  Filled 2015-08-05: qty 30

## 2015-08-05 MED ORDER — MIDAZOLAM HCL 2 MG/2ML IJ SOLN
INTRAMUSCULAR | Status: DC | PRN
  Administered 2015-08-05 (×2): 1 mg via INTRAVENOUS

## 2015-08-05 MED ORDER — ONDANSETRON HCL 4 MG/2ML IJ SOLN
INTRAMUSCULAR | Status: AC
  Filled 2015-08-05: qty 2

## 2015-08-05 MED ORDER — DEXAMETHASONE SODIUM PHOSPHATE 10 MG/ML IJ SOLN
INTRAMUSCULAR | Status: AC
  Filled 2015-08-05: qty 1

## 2015-08-05 MED ORDER — LIDOCAINE HCL (CARDIAC) 20 MG/ML IV SOLN
INTRAVENOUS | Status: AC
  Filled 2015-08-05: qty 5

## 2015-08-05 MED ORDER — POTASSIUM CHLORIDE IN NACL 20-0.9 MEQ/L-% IV SOLN
INTRAVENOUS | Status: DC
  Administered 2015-08-05 – 2015-08-06 (×2): via INTRAVENOUS
  Filled 2015-08-05 (×2): qty 1000

## 2015-08-05 MED ORDER — METRONIDAZOLE IN NACL 5-0.79 MG/ML-% IV SOLN
INTRAVENOUS | Status: AC
  Filled 2015-08-05: qty 100

## 2015-08-05 MED ORDER — METRONIDAZOLE IN NACL 5-0.79 MG/ML-% IV SOLN
500.0000 mg | INTRAVENOUS | Status: AC
  Administered 2015-08-05: 500 mg via INTRAVENOUS

## 2015-08-05 MED ORDER — SODIUM CHLORIDE 0.9 % IJ SOLN
INTRAMUSCULAR | Status: AC
  Filled 2015-08-05: qty 20

## 2015-08-05 MED ORDER — DEXAMETHASONE SODIUM PHOSPHATE 10 MG/ML IJ SOLN
INTRAMUSCULAR | Status: DC | PRN
Start: 2015-08-05 — End: 2015-08-05
  Administered 2015-08-05: 10 mg via INTRAVENOUS

## 2015-08-05 MED ORDER — BUPIVACAINE-EPINEPHRINE 0.25% -1:200000 IJ SOLN
INTRAMUSCULAR | Status: AC
  Filled 2015-08-05: qty 1

## 2015-08-05 MED ORDER — HYDROMORPHONE HCL 1 MG/ML IJ SOLN
INTRAMUSCULAR | Status: AC
  Filled 2015-08-05: qty 1

## 2015-08-05 MED ORDER — FENTANYL CITRATE (PF) 250 MCG/5ML IJ SOLN
INTRAMUSCULAR | Status: AC
  Filled 2015-08-05: qty 5

## 2015-08-05 MED ORDER — HYDROMORPHONE HCL 1 MG/ML IJ SOLN
0.2500 mg | INTRAMUSCULAR | Status: DC | PRN
  Administered 2015-08-05 (×4): 0.5 mg via INTRAVENOUS

## 2015-08-05 MED ORDER — LIDOCAINE HCL (CARDIAC) 20 MG/ML IV SOLN
INTRAVENOUS | Status: DC | PRN
  Administered 2015-08-05: 40 mg via INTRAVENOUS

## 2015-08-05 MED ORDER — PHENYLEPHRINE HCL 10 MG/ML IJ SOLN
INTRAMUSCULAR | Status: DC | PRN
  Administered 2015-08-05 (×7): 80 ug via INTRAVENOUS

## 2015-08-05 MED ORDER — 0.9 % SODIUM CHLORIDE (POUR BTL) OPTIME
TOPICAL | Status: DC | PRN
  Administered 2015-08-05: 1000 mL

## 2015-08-05 MED ORDER — SODIUM CHLORIDE 0.9 % IJ SOLN
INTRAMUSCULAR | Status: AC
  Filled 2015-08-05: qty 10

## 2015-08-05 MED ORDER — PHENYLEPHRINE 40 MCG/ML (10ML) SYRINGE FOR IV PUSH (FOR BLOOD PRESSURE SUPPORT)
PREFILLED_SYRINGE | INTRAVENOUS | Status: AC
  Filled 2015-08-05: qty 30

## 2015-08-05 MED ORDER — ACETAMINOPHEN 500 MG PO TABS
ORAL_TABLET | ORAL | Status: AC
  Filled 2015-08-05: qty 2

## 2015-08-05 MED ORDER — PHENYLEPHRINE 40 MCG/ML (10ML) SYRINGE FOR IV PUSH (FOR BLOOD PRESSURE SUPPORT)
PREFILLED_SYRINGE | INTRAVENOUS | Status: AC
  Filled 2015-08-05: qty 10

## 2015-08-05 MED ORDER — SUCCINYLCHOLINE CHLORIDE 20 MG/ML IJ SOLN
INTRAMUSCULAR | Status: DC | PRN
  Administered 2015-08-05: 100 mg via INTRAVENOUS

## 2015-08-05 MED ORDER — ACETAMINOPHEN 500 MG PO TABS
1000.0000 mg | ORAL_TABLET | ORAL | Status: AC
  Administered 2015-08-05: 1000 mg via ORAL

## 2015-08-05 MED ORDER — LACTATED RINGERS IV SOLN
INTRAVENOUS | Status: DC
  Administered 2015-08-05: 09:00:00 via INTRAVENOUS

## 2015-08-05 MED ORDER — POTASSIUM CHLORIDE IN NACL 20-0.9 MEQ/L-% IV SOLN
INTRAVENOUS | Status: DC
  Filled 2015-08-05 (×2): qty 1000

## 2015-08-05 MED ORDER — CHLORHEXIDINE GLUCONATE CLOTH 2 % EX PADS
6.0000 | MEDICATED_PAD | Freq: Once | CUTANEOUS | Status: AC
  Administered 2015-08-05: 6 via TOPICAL

## 2015-08-05 MED ORDER — MIDAZOLAM HCL 2 MG/2ML IJ SOLN
INTRAMUSCULAR | Status: AC
  Filled 2015-08-05: qty 2

## 2015-08-05 MED ORDER — SUGAMMADEX SODIUM 200 MG/2ML IV SOLN
INTRAVENOUS | Status: DC | PRN
  Administered 2015-08-05: 150 mg via INTRAVENOUS

## 2015-08-05 MED ORDER — CEFAZOLIN SODIUM-DEXTROSE 2-4 GM/100ML-% IV SOLN
INTRAVENOUS | Status: AC
  Filled 2015-08-05: qty 100

## 2015-08-05 MED ORDER — ROCURONIUM BROMIDE 100 MG/10ML IV SOLN
INTRAVENOUS | Status: DC | PRN
  Administered 2015-08-05: 25 mg via INTRAVENOUS

## 2015-08-05 MED ORDER — HYDROMORPHONE HCL 2 MG PO TABS
2.0000 mg | ORAL_TABLET | ORAL | Status: DC | PRN
  Administered 2015-08-05: 2 mg via ORAL
  Filled 2015-08-05: qty 1

## 2015-08-05 MED ORDER — PROPOFOL 10 MG/ML IV BOLUS
INTRAVENOUS | Status: DC | PRN
  Administered 2015-08-05: 50 mg via INTRAVENOUS
  Administered 2015-08-05: 150 mg via INTRAVENOUS

## 2015-08-05 MED ORDER — ROCURONIUM BROMIDE 100 MG/10ML IV SOLN
INTRAVENOUS | Status: AC
  Filled 2015-08-05: qty 1

## 2015-08-05 MED ORDER — DEXAMETHASONE SODIUM PHOSPHATE 10 MG/ML IJ SOLN
INTRAMUSCULAR | Status: AC
  Filled 2015-08-05: qty 2

## 2015-08-05 MED ORDER — CHLORHEXIDINE GLUCONATE CLOTH 2 % EX PADS
6.0000 | MEDICATED_PAD | Freq: Once | CUTANEOUS | Status: AC
Start: 2015-08-05 — End: 2015-08-05
  Administered 2015-08-05: 6 via TOPICAL

## 2015-08-05 MED ORDER — HYDROMORPHONE HCL 1 MG/ML IJ SOLN
1.0000 mg | INTRAMUSCULAR | Status: DC | PRN
  Administered 2015-08-05 – 2015-08-06 (×2): 1 mg via INTRAVENOUS
  Filled 2015-08-05 (×2): qty 1

## 2015-08-05 MED ORDER — CEFAZOLIN SODIUM-DEXTROSE 2-4 GM/100ML-% IV SOLN
2.0000 g | INTRAVENOUS | Status: AC
  Administered 2015-08-05: 2 g via INTRAVENOUS

## 2015-08-05 MED ORDER — ONDANSETRON HCL 4 MG/2ML IJ SOLN
INTRAMUSCULAR | Status: DC | PRN
  Administered 2015-08-05: 4 mg via INTRAVENOUS

## 2015-08-05 MED ORDER — GABAPENTIN 300 MG PO CAPS
300.0000 mg | ORAL_CAPSULE | ORAL | Status: AC
  Administered 2015-08-05: 300 mg via ORAL
  Filled 2015-08-05: qty 1

## 2015-08-05 MED ORDER — BUPIVACAINE-EPINEPHRINE (PF) 0.5% -1:200000 IJ SOLN
INTRAMUSCULAR | Status: DC | PRN
  Administered 2015-08-05: 25 mL

## 2015-08-05 SURGICAL SUPPLY — 38 items
APL SKNCLS STERI-STRIP NONHPOA (GAUZE/BANDAGES/DRESSINGS)
BENZOIN TINCTURE PRP APPL 2/3 (GAUZE/BANDAGES/DRESSINGS) ×1 IMPLANT
BLADE SURG SZ10 CARB STEEL (BLADE) ×3 IMPLANT
CLOSURE WOUND 1/2 X4 (GAUZE/BANDAGES/DRESSINGS)
COVER SURGICAL LIGHT HANDLE (MISCELLANEOUS) ×1 IMPLANT
DECANTER SPIKE VIAL GLASS SM (MISCELLANEOUS) ×3 IMPLANT
DISSECTOR ROUND CHERRY 3/8 STR (MISCELLANEOUS) ×2 IMPLANT
DRAIN PENROSE 18X1/2 LTX STRL (DRAIN) ×1 IMPLANT
DRAPE LAPAROTOMY TRNSV 102X78 (DRAPE) ×3 IMPLANT
ELECT PENCIL ROCKER SW 15FT (MISCELLANEOUS) ×3 IMPLANT
ELECT REM PT RETURN 9FT ADLT (ELECTROSURGICAL) ×3
ELECTRODE REM PT RTRN 9FT ADLT (ELECTROSURGICAL) ×1 IMPLANT
GAUZE SPONGE 4X4 12PLY STRL (GAUZE/BANDAGES/DRESSINGS) ×1 IMPLANT
GLOVE BIOGEL PI IND STRL 7.0 (GLOVE) ×1 IMPLANT
GLOVE BIOGEL PI INDICATOR 7.0 (GLOVE) ×2
GLOVE SURG SIGNA 7.5 PF LTX (GLOVE) ×3 IMPLANT
GOWN SPEC L4 XLG W/TWL (GOWN DISPOSABLE) ×1 IMPLANT
GOWN STRL REUS W/TWL LRG LVL3 (GOWN DISPOSABLE) ×7 IMPLANT
KIT BASIN OR (CUSTOM PROCEDURE TRAY) ×3 IMPLANT
LIQUID BAND (GAUZE/BANDAGES/DRESSINGS) ×2 IMPLANT
MESH ULTRAPRO 3X6 7.6X15CM (Mesh General) ×2 IMPLANT
NDL HYPO 25X1 1.5 SAFETY (NEEDLE) ×1 IMPLANT
NEEDLE HYPO 25X1 1.5 SAFETY (NEEDLE) ×3 IMPLANT
NS IRRIG 1000ML POUR BTL (IV SOLUTION) ×3 IMPLANT
PACK BASIC VI WITH GOWN DISP (CUSTOM PROCEDURE TRAY) ×3 IMPLANT
SPONGE LAP 18X18 X RAY DECT (DISPOSABLE) ×3 IMPLANT
SPONGE LAP 4X18 X RAY DECT (DISPOSABLE) IMPLANT
STRIP CLOSURE SKIN 1/2X4 (GAUZE/BANDAGES/DRESSINGS) IMPLANT
SUT CHROMIC 0 SH (SUTURE) IMPLANT
SUT MNCRL AB 4-0 PS2 18 (SUTURE) ×2 IMPLANT
SUT NOVA 0 T19/GS 22DT (SUTURE) ×8 IMPLANT
SUT VIC AB 2-0 SH 27 (SUTURE) ×6
SUT VIC AB 2-0 SH 27X BRD (SUTURE) ×1 IMPLANT
SUT VIC AB 3-0 SH 18 (SUTURE) ×5 IMPLANT
SYR BULB IRRIGATION 50ML (SYRINGE) ×3 IMPLANT
SYR CONTROL 10ML LL (SYRINGE) ×3 IMPLANT
TOWEL OR 17X26 10 PK STRL BLUE (TOWEL DISPOSABLE) ×3 IMPLANT
YANKAUER SUCT BULB TIP 10FT TU (MISCELLANEOUS) ×3 IMPLANT

## 2015-08-05 NOTE — Progress Notes (Deleted)
OT will check on pt next day for OT eval   Thanks, Lise AuerLori Slater Mcmanaman, [place] [phone]

## 2015-08-05 NOTE — Op Note (Signed)
08/04/2015 - 08/05/2015  11:13 AM  PATIENT:  Deborah Sellers, 70 y.o., female, MRN: [id number]  PREOP DIAGNOSIS:  incarcerated left inguinal hernia  POSTOP DIAGNOSIS:   Incarcerated indirect left inguinal hernia  PROCEDURE:   Procedure(s): Open left HERNIA REPAIR INGUINAL ADULT, INSERTION OF MESH  SURGEON:   Ovidio Kinavid Ferrell Claiborne, M.D.  ANESTHESIA:   general  Anesthesiologist: Gaynelle AduWilliam Fitzgerald, MD CRNA: Minerva EndsAngela M Mirarchi, CRNA  General  EBL:  minimal  ml  LOCAL MEDICATIONS USED:   10 cc Exparel (mixed with 20 cc saline)  SPECIMEN:   None  COUNTS CORRECT:  YES  INDICATIONS FOR PROCEDURE:  Deborah Sellers is a 70 y.o. (DOB: 09/17/1945) white  female whose primary care physician is Garlan FillersPATERSON,DANIEL G, MD and comes for left inguinal hernia repair.  [facility].  Dr. Michaell CowingGross was able to reduce the hernia and I talked to her about going ahead and repairing the left inguinal hernia.   The indications and risks of the hernia surgery were explained to the patient.  The risks include, but are not limited to, infection, bleeding, recurrence of the hernia, and nerve injury.   She had a left flank block by anesthesia pre op.  Operative Note: The patient [facility]  She underwent a general anesthesia.    A time out was held and the surgical checklist run.  His lower abdomen was shaved and then prepped with chloroprep.  A left inguinal incision was made through the subcutaneous fat.  The patient had a well developed Scarpa's fascia.  I took the incision to the external oblique fascia.  The external ring was opened.   The patient had an indirect inguinal hernia. I divided the round ligament.  The hernia sac protruded about 9 cm and was very thick walled, consistent with her history of long standing left inguinal hernia.  I identified the ileo inguinal nerve and preserved this during the dissection.  The patient had a hernia sac  the came out through the internal ring and was inverted back into the subfascial space and closed with a 2-0 Vicryl suture.  I did not try to resect the sac.  The inguinal floor was repaired with a 3 x 6 inch piece of Ultrapro mesh.  The mesh was cut to fit the inguinal floor.  The mesh was sewn in place with interrupted 0 Novafil suture.    The mesh lay flat.  The inguinal floor was covered and the internal ring covered.  The external oblique was closed with a 3-0 vicryl.  The fascia and subcutaneous tissues were infiltrated with 10 cc of Exparel.  I mixed this with 20 cc of saline.  The skin was closed with 4-0 monocryl and painted with LiquidBand. The sponge and needle count were correct at the end of the case.  The patient was transported to the recovery room in good condition.  Ovidio Kinavid Devery Odwyer, MD, [facility] Pager: [phone] Office phone:  [phone]

## 2015-08-05 NOTE — Progress Notes (Signed)
Left inguinal hernia reduced and patient is doing better.  I discussed with patient about going ahead with surgery today vs being discharged and having the hernia repair electively in 4 to 6 weeks.  She has known about the hernia for some time, but it needs to be fixed.  She wants to go ahead with surgery now.  The big issue will be her post op pain control.  She is on a fentanyl patch and taking dilaudid 2 to 4 mg q 4 hours for her back.  She just had cervical spine [facility].  She also has had some issues with her bowels secondary to narcotics.  I discussed the indications and complications of hernia surgery with the patient.  I discussed both the laparoscopic and open approach to hernia repair.  The potential risks of hernia surgery include, but are not limited to, bleeding, infection, open surgery, nerve injury, and recurrence of the hernia.  Because of her prior lower abdominal incision, I think she is best served with an open left inguinal hernia repair.  Her husband was at the bedside.  Ovidio Kinavid Kyan Yurkovich, MD, [facility] Pager: [phone] Office phone:  [phone]

## 2015-08-05 NOTE — Anesthesia Procedure Notes (Addendum)
Anesthesia Regional Block:  TAP block  Pre-Anesthetic Checklist: ,, timeout performed, Correct Patient, Correct Site, Correct Laterality, Correct Procedure, Correct Position, site marked, Risks and benefits discussed, pre-op evaluation, post-op pain management  Laterality: Left  Prep: Maximum Sterile Barrier Precautions used and chloraprep       Needles:  Injection technique: Single-shot  Needle Type: Echogenic Stimulator Needle     Needle Length: 9cm 9 cm Needle Gauge: 21 and 21 G    Additional Needles:  Procedures: ultrasound guided (picture in chart) TAP block Narrative:  Start time: 08/05/2015 9:13 AM End time: 08/05/2015 9:23 AM Injection made incrementally with aspirations every 5 mL. Anesthesiologist: Gaynelle AduFITZGERALD, WILLIAM  Additional Notes: 2% Lidocaine skin wheel.   Procedure Name: Intubation Date/Time: 08/05/2015 9:38 AM Performed by: Minerva EndsMIRARCHI, Letesha Klecker M Pre-anesthesia Checklist: Patient identified, Emergency Drugs available, Suction available and Patient being monitored Patient Re-evaluated:Patient Re-evaluated prior to inductionOxygen Delivery Method: Circle System Utilized Preoxygenation: Pre-oxygenation with 100% oxygen Intubation Type: IV induction Ventilation: Mask ventilation without difficulty Laryngoscope Size: Miller and 2 Grade View: Grade III Tube type: Oral Tube size: 7.0 mm Number of attempts: 1 Airway Equipment and Method: Stylet Placement Confirmation: ETT inserted through vocal cords under direct vision,  positive ETCO2 and breath sounds checked- equal and bilateral Secured at: 22 cm Tube secured with: Tape Dental Injury: Teeth and Oropharynx as per pre-operative assessment  Comments: Smooth RSI   AM CRNA Sampson Goon- Fitzgerald MD cricoid pressure--  Intubation AM CRNA atraumatic- front tooth with small chip prior to laryngoscopy---  Very limited neck movement-- anterior view--  Recommend Glidescope for future intubations

## 2015-08-05 NOTE — Progress Notes (Signed)
OT Cancellation Note  Patient Details Name: [name] MRN: [id number] DOB: [birth year]   Cancelled Treatment:    Reason Eval/Treat Not Completed: Other (comment)  OT order received but eval deferred. Pt for surgery this am.   Nicanor AlconREDDING, Lowry Bala D  Lori Jeanetta Alonzo, [place] [phone] 08/05/2015, 2:34 PM

## 2015-08-05 NOTE — Progress Notes (Signed)
PT Cancellation Note  Patient Details Name: Deborah Sellers MRN: [id number] DOB: 1945/08/25   Cancelled Treatment:     PT order received but eval deferred.  Pt for surgery this am.  Will follow.   Aland Chestnutt 08/05/2015, 2:22 PM

## 2015-08-05 NOTE — Progress Notes (Signed)
AssistedDr. Autumn PattyEdmond Fitzgerald with left, ultrasound guided transabdominal plane block. Side rails up, monitors on throughout procedure. See vital signs in flow sheet. Tolerated Procedure well.

## 2015-08-05 NOTE — Progress Notes (Signed)
[facility] Progress Note   Deborah RanStephanie Sellers  ZOX:[id number]  DOB: 22-Aug-1945  DOA: 08/04/2015 PCP: Garlan FillersPATERSON,DANIEL G, MD  Brief narrative: Deborah RanStephanie Sellers is a 70 y.o. female with multiple back surgeries, chronic pain on narcotics presenting with nausea, vomiting and abdominal pain for 3 days. Found to have an incarcerated left inguinal hernia.    Subjective: Seen after surgery. No complaints of pain, nausea, vomiting- would like to eat.   Assessment/Plan: Principal Problem:   SBO (small bowel obstruction) due to incarcerated left inguinal hernia - s/p surgery - management per surgical team  Active Problems:   Essential (primary) hypertension - hold Valsartan/HCTZ for now    H/O anxiety state - cont Xanax     Chronic back pain - she is weaning herself off of Dilaudid at home- now takes 1 2 mg tab every 4-6 hrs - cont Fentanyl patch  Constipation - hold Movantik   Antibiotics: Anti-infectives    Start     Dose/Rate Route Frequency Ordered Stop   08/05/15 0600  ceFAZolin (ANCEF) IVPB 2g/100 mL premix     2 g 200 mL/hr over 30 Minutes Intravenous On call to O.R. 08/05/15 0354 08/05/15 0944   08/05/15 0600  metroNIDAZOLE (FLAGYL) IVPB 500 mg     500 mg 100 mL/hr over 60 Minutes Intravenous On call to O.R. 08/05/15 0354 08/05/15 0913     Code Status:     Code Status Orders        Start     Ordered   08/04/15 2206  Full code   Continuous     08/04/15 2205    Code Status History    Date Active Date Inactive Code Status Order ID Comments User Context   This patient has a current code status but no historical code status.     Family Communication:   Disposition Plan: per surgery DVT prophylaxis: Lovenox Consultants: gen surgery Procedures: hernia repair    Objective: Filed Weights   08/04/15 1642  Weight: 58.968 kg (130 lb)    Intake/Output Summary (Last 24 hours) at 08/05/15 1753 Last data filed at 08/05/15 1632  Gross per 24 hour  Intake    2850 ml  Output    920 ml  Net   1930 ml     Vitals Filed Vitals:   08/05/15 1416 08/05/15 1535 08/05/15 1629 08/05/15 1631  BP: 103/52 106/53  106/53  Pulse: 67 68  64  Temp: 98.3 F (36.8 C) 97.7 F (36.5 C)  98.4 F (36.9 C)  TempSrc: Oral Oral  Oral  Resp: 16 18  16   Height:      Weight:      SpO2: 95% 98% 88% 97%    Exam:  General:  Pt is alert, not in acute distress  HEENT: No icterus, No thrush, oral mucosa moist  Cardiovascular: regular rate and rhythm, S1/S2 No murmur  Respiratory: clear to auscultation bilaterally   Abdomen: Soft, +Bowel sounds, + tender in lower abdomen, non distended, no guarding  MSK: No cyanosis or clubbing- no pedal edema   Data Reviewed: Basic Metabolic Panel:  Recent Labs Lab 08/04/15 1749 08/05/15 0416  NA 135 137  K 3.5 3.7  CL 94* 103  CO2 28 27  GLUCOSE 105* 105*  BUN 32* 21*  CREATININE 0.99 0.72  CALCIUM 9.2 8.7*   Liver Function Tests:  Recent Labs Lab 08/04/15 1749  AST 21  ALT 10*  ALKPHOS 66  BILITOT 0.8  PROT 8.0  ALBUMIN 4.0  No results for input(s): LIPASE, AMYLASE in the last 168 hours. No results for input(s): AMMONIA in the last 168 hours. CBC:  Recent Labs Lab 08/04/15 1749 08/05/15 0416  WBC 12.5* 11.9*  NEUTROABS 9.5*  --   HGB 10.4* 9.5*  HCT 33.3* 30.7*  MCV 91.5 90.0  PLT 608* 518*   Cardiac Enzymes: No results for input(s): CKTOTAL, CKMB, CKMBINDEX, TROPONINI in the last 168 hours. BNP (last 3 results) No results for input(s): BNP in the last 8760 hours.  ProBNP (last 3 results) No results for input(s): PROBNP in the last 8760 hours.  CBG: No results for input(s): GLUCAP in the last 168 hours.  Recent Results (from the past 240 hour(s))  Surgical pcr screen     Status: None   Collection Time: 08/05/15  4:10 AM  Result Value Ref Range Status   MRSA, PCR NEGATIVE NEGATIVE Final   Staphylococcus aureus NEGATIVE NEGATIVE Final    Comment:        The Xpert SA Assay  (FDA approved for NASAL specimens in patients over 76 years of age), is one component of a comprehensive surveillance program.  Test performance has been validated by [facility] for patients greater than or equal to 70 year old. It is not intended to diagnose infection nor to guide or monitor treatment.      Studies: Ct Abdomen Pelvis W Contrast  08/04/2015  CLINICAL DATA:  Acute onset of intermittent generalized abdominal cramping, with constipation. Initial encounter. EXAM: CT ABDOMEN AND PELVIS WITH CONTRAST TECHNIQUE: Multidetector CT imaging of the abdomen and pelvis was performed using the standard protocol following bolus administration of intravenous contrast. CONTRAST:  ISOVUE-300 IOPAMIDOL (ISOVUE-300) INJECTION 61% COMPARISON:  MRI of the lumbar spine performed 07/04/2015, and CT of the lumbar spine performed 11/09/2014 FINDINGS: The visualized lung bases are clear. The stomach is partially filled with fluid and is unremarkable in appearance. An anterior abdominal wall mesh is noted at the upper abdomen. There is dilatation of small bowel loops up to 4.5 cm in maximal diameter, to the level of herniation of a short segment of proximal to mid ileum into a small left inguinal hernia, with associated high-grade obstruction. The herniated loop is mildly dilated, with trace surrounding fluid. More distal small bowel is completely decompressed. A small amount of inflammation is noted tracking about the dilated proximal ileal loops. The liver and spleen are unremarkable in appearance. The gallbladder is within normal limits. The pancreatic duct is mildly distended, measuring up to 4 mm. Would correlate with pancreatic lab values. The adrenal glands are unremarkable. The kidneys are unremarkable in appearance. There is no evidence of hydronephrosis. No renal or ureteral stones are seen. No perinephric stranding is appreciated. No acute vascular abnormalities are seen. The appendix is  decompressed and unremarkable in appearance. The colon is largely decompressed, with diffuse diverticulosis noted along the entirety of the colon. There is fatty infiltration at the ileocecal junction. There appears to be mild wall thickening along the mid sigmoid colon, which could reflect mild acute diverticulitis or mild focal colitis. There is prominent enhancement about the rectum, raising concern for internal hemorrhoids. The bladder is mildly distended and grossly remarkable. The uterus is unremarkable in appearance. The ovaries are relatively symmetric. No suspicious adnexal masses are seen. No inguinal lymphadenopathy is seen. No acute osseous abnormalities are identified. Thoracolumbar spinal fusion hardware is noted, with underlying decompression. Diffuse underlying degenerative change is seen. There appears to be a chronic displaced fracture at  the inferior edge of S1, with just over 1 cm of anterior and inferior displacement of S1 in relation to S2, with associated angulation; this is grossly stable from prior studies. IMPRESSION: 1. Relatively high-grade small bowel obstruction noted, with dilatation of small-bowel loops to 4.5 cm in maximal diameter. This reflects an obstructing small left inguinal hernia, containing a short segment of dilated proximal to mid ileum. Small amount of associated fluid noted within the hernia. Mild inflammation seen tracking about the dilated proximal ileal loops. Distal small bowel loops are decompressed. 2. Mild wall thickening along the mid sigmoid colon could reflect mild acute diverticulitis or mild focal colitis. 3. Prominent enhancement about the rectum raises concern for internal hemorrhoids. 4. Pancreatic duct is mildly distended, measuring up to 4 mm. Would correlate with pancreatic lab values. 5. 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 This  is grossly stable from prior studies. Electronically Signed   By: Roanna Raider M.D.   On: 08/04/2015 19:01   Dg Abd Acute W/chest  08/04/2015  CLINICAL DATA:  Constipation-No BM X 4 days. Low abdominal pain that is now all over generalized abdominal pain x 4 days. HTN, never smoker, kyphoscoliosis, post laminectomy syndrome, h/o hernia. Sx: back sx-2007, diaphragmatic hernia repair-2007 EXAM: DG ABDOMEN ACUTE W/ 1V CHEST COMPARISON:  None applicable FINDINGS: Posterior spinal fixation rods throughout the thoracolumbar spine. Heart size is upper normal. Lungs are clear. There is no free intraperitoneal air beneath diaphragm. Numerous dilated small bowel loops are present and contain air-fluid levels on the erect view. Colonic loops are not dilated. Paucity of large bowel gas. IMPRESSION: Partial or early small bowel obstruction. Electronically Signed   By: Norva Pavlov M.D.   On: 08/04/2015 17:19    Scheduled Meds:  Scheduled Meds: . bisacodyl  10 mg Rectal Daily  . enoxaparin (LOVENOX) injection  40 mg Subcutaneous Q24H  . escitalopram  10 mg Oral Daily  . fentaNYL  75 mcg Transdermal Q72H  . HYDROmorphone      . HYDROmorphone      . lidocaine  1 application Other Once  . lip balm  1 application Topical BID  . methocarbamol  750 mg Oral TID   Continuous Infusions: . 0.9 % NaCl with KCl 20 mEq / L 75 mL/hr at 08/05/15 1517    Time spent on care of this patient: 35 min   Brien Lowe, MD 08/05/2015, 5:53 PM  LOS: 1 day   [facility] Office  [phone] Pager - Text Page per [email or web address] If 7PM-7AM, please contact night-coverage [email or web address]

## 2015-08-05 NOTE — Anesthesia Preprocedure Evaluation (Addendum)
Anesthesia Evaluation  Patient identified by MRN, date of birth, ID band Patient awake    Reviewed: Allergy & Precautions, H&P , NPO status , Patient's Chart, lab work & pertinent test results, reviewed documented beta blocker date and time   Airway Mallampati: II  TM Distance: >3 FB Neck ROM: Full    Dental no notable dental hx. (+) Teeth Intact, Dental Advisory Given, Chipped   Pulmonary neg pulmonary ROS,    Pulmonary exam normal breath sounds clear to auscultation       Cardiovascular hypertension, Pt. on medications and Pt. on home beta blockers  Rhythm:Regular Rate:Normal     Neuro/Psych Anxiety negative neurological ROS  negative psych ROS   GI/Hepatic negative GI ROS, Neg liver ROS,   Endo/Other  negative endocrine ROS  Renal/GU negative Renal ROS  negative genitourinary   Musculoskeletal   Abdominal   Peds  Hematology negative hematology ROS (+)   Anesthesia Other Findings   Reproductive/Obstetrics negative OB ROS                           Anesthesia Physical Anesthesia Plan  ASA: II  Anesthesia Plan: General and Regional   Post-op Pain Management: GA combined w/ Regional for post-op pain   Induction: Intravenous  Airway Management Planned: LMA and Oral ETT  Additional Equipment:   Intra-op Plan:   Post-operative Plan: Extubation in OR  Informed Consent: I have reviewed the patients History and Physical, chart, labs and discussed the procedure including the risks, benefits and alternatives for the proposed anesthesia with the patient or authorized representative who has indicated his/her understanding and acceptance.   Dental advisory given  Plan Discussed with: CRNA  Anesthesia Plan Comments:         Anesthesia Quick Evaluation

## 2015-08-05 NOTE — Transfer of Care (Signed)
Immediate Anesthesia Transfer of Care Note  Patient: Deborah RanStephanie Sellers  Procedure(s) Performed: Procedure(s): HERNIA REPAIR INGUINAL ADULT (Left) INSERTION OF MESH (Left)  Patient Location: PACU  Anesthesia Type:General  Level of Consciousness: sedated  Airway & Oxygen Therapy: Patient Spontanous Breathing and Patient connected to face mask oxygen  Post-op Assessment: Report given to RN and Post -op Vital signs reviewed and stable  Post vital signs: Reviewed and stable  Last Vitals:  Filed Vitals:   08/05/15 0924 08/05/15 0925  BP:  107/56  Pulse: 75 77  Temp:    Resp: 17 19    Last Pain:  Filed Vitals:   08/05/15 0926  PainSc: 3          Complications: No apparent anesthesia complications

## 2015-08-05 NOTE — Anesthesia Postprocedure Evaluation (Signed)
Anesthesia Post Note  Patient: Deborah Sellers  Procedure(s) Performed: Procedure(s) (LRB): HERNIA REPAIR INGUINAL ADULT (Left) INSERTION OF MESH (Left)  Patient location during evaluation: PACU Anesthesia Type: General and Regional Level of consciousness: awake and alert Pain management: pain level controlled Vital Signs Assessment: post-procedure vital signs reviewed and stable Respiratory status: spontaneous breathing, nonlabored ventilation, respiratory function stable and patient connected to nasal cannula oxygen Cardiovascular status: blood pressure returned to baseline and stable Postop Assessment: no signs of nausea or vomiting Anesthetic complications: no    Last Vitals:  Filed Vitals:   08/05/15 1225 08/05/15 1230  BP:  115/73  Pulse: 73 73  Temp:  36.9 C  Resp: 13 13    Last Pain:  Filed Vitals:   08/05/15 1239  PainSc: 6                  Theodore Rahrig,W. EDMOND

## 2015-08-06 MED ORDER — NALOXEGOL OXALATE 12.5 MG PO TABS: 12.5000 mg | ORAL_TABLET | Freq: Every day | ORAL | Status: DC

## 2015-08-06 NOTE — Discharge Summary (Signed)
Physician Discharge Summary  Patient ID: Deborah RanStephanie Gavia MRN: [id number] DOB/AGE: 1945-10-10 70 y.o.  Admit date: 08/04/2015 Discharge date: 08/06/2015  Admission Diagnoses:  SBO from incarcerated inguinal hernia  Discharge Diagnoses:  Same post hernia repair  Principal Problem:   SBO (small bowel obstruction) (HCC) Active Problems:   Essential (primary) hypertension   H/O anxiety state   Idiopathic scoliosis and kyphoscoliosis   Tachycardia   Chronic constipation   Diversion colitis   Chronic back pain   Surgery:  Repair of inguinal hernia  Discharged Condition: improved  Hospital Course:   Admitted with inguinal hernia which was incarcerated and reduced at time of admission.  Hernia repaired by Dr. Ezzard StandingNewman on Friday and patient ready for discharge on Saturday.    Consults: Dossie Arbouran Paterson  Significant Diagnostic Studies: none    Discharge Exam: Blood pressure 107/56, pulse 74, temperature 97.9 F (36.6 C), temperature source Oral, resp. rate 16, height 5' (1.524 m), weight 58.968 kg (130 lb), SpO2 100 %. Incision healing OK  Disposition:   Discharge Instructions    Diet - low sodium heart healthy    Complete by:  As directed      Discharge instructions    Complete by:  As directed   May shower ad lib     Increase activity slowly    Complete by:  As directed             Medication List    TAKE these medications        acetaminophen 500 MG tablet  Commonly known as:  TYLENOL  Take 1,000 mg by mouth every 8 (eight) hours as needed for moderate pain.     ALPRAZolam 0.25 MG tablet  Commonly known as:  XANAX  Take 0.25 mg by mouth 2 (two) times daily as needed for anxiety or sleep.     b complex vitamins tablet  Take 1 tablet by mouth daily.     escitalopram 10 MG tablet  Commonly known as:  LEXAPRO  Take 10 mg by mouth daily.     fentaNYL 75 MCG/HR  Commonly known as:  DURAGESIC - dosed mcg/hr  Place 75 mcg onto the skin every 3 (three) days.     Fish Oil 1000 MG Caps  Take 1 capsule by mouth daily.     FORTEO [place]  Inject 1 Dose into the skin daily.     HYDROmorphone 2 MG tablet  Commonly known as:  DILAUDID  Take 2 mg by mouth every 6 (six) hours as needed for severe pain. 1-2 tabs PO Q 3 hours PRN     methocarbamol 750 MG tablet  Commonly known as:  ROBAXIN  Take 750 mg by mouth 3 (three) times daily.     MOVANTIK 25 MG Tabs tablet  Generic drug:  naloxegol oxalate  Take 25 mg by mouth daily.     naloxegol oxalate 12.5 MG Tabs tablet  Commonly known as:  MOVANTIK  Take 1 tablet (12.5 mg total) by mouth daily.     ondansetron 4 MG tablet  Commonly known as:  ZOFRAN  Take 4 mg by mouth every 6 (six) hours as needed for nausea or vomiting (up to 7 days).     pregabalin 100 MG capsule  Commonly known as:  LYRICA  Take 100 mg by mouth every 12 (twelve) hours.     propranolol 10 MG tablet  Commonly known as:  INDERAL  Take 10 mg by mouth daily as needed (anxiety).  valsartan-hydrochlorothiazide 160-12.5 MG tablet  Commonly known as:  DIOVAN-HCT  Take 1 tablet by mouth daily.           Follow-up Information    Follow up with [facility] - [facility] H, MD.   Specialty:  General Surgery   Contact information:   [address] [place] [address] [phone]       Signed: Valarie Merino 08/06/2015, 8:32 AM

## 2015-08-06 NOTE — Progress Notes (Signed)
OT Discharge  Note  Patient Details Name: Alric RanStephanie Caccamo MRN: [id number] DOB: [birth year]   Cancelled Treatment:    Reason Eval/Treat Not Completed: OT screened, no needs identified, will sign off. Pt states "i dont really need you. I have everything from the last time I was here and I just dont need to do this. "   Yetta BarreJones, Jetty PeeksJessica B   Uriah Trueba, Brynn   OTR/L Pager: [phone] Office: [phone] .  08/06/2015, 9:02 AM

## 2015-08-06 NOTE — Progress Notes (Signed)
PT  Note  Patient Details Name: Deborah RanStephanie Sellers MRN: [id number] DOB: Mar 04, 1945   Cancelled Treatment:     PT order received but eval deferred.  Pt reports ambulating independently in halls and with no PT needs at this time.  RN agrees.  Will dc PT services at this time.   Ja Ohman 08/06/2015, 9:29 AM

## 2015-08-30 DIAGNOSIS — M549 Dorsalgia, unspecified: Secondary | ICD-10-CM | POA: Diagnosis not present

## 2015-08-30 DIAGNOSIS — R29818 Other symptoms and signs involving the nervous system: Secondary | ICD-10-CM | POA: Diagnosis not present

## 2015-08-30 DIAGNOSIS — M4322 Fusion of spine, cervical region: Secondary | ICD-10-CM | POA: Diagnosis not present

## 2015-08-30 DIAGNOSIS — R269 Unspecified abnormalities of gait and mobility: Secondary | ICD-10-CM | POA: Diagnosis not present

## 2015-08-30 DIAGNOSIS — M4325 Fusion of spine, thoracolumbar region: Secondary | ICD-10-CM | POA: Diagnosis not present

## 2015-09-06 DIAGNOSIS — M542 Cervicalgia: Secondary | ICD-10-CM | POA: Diagnosis not present

## 2015-09-06 DIAGNOSIS — R262 Difficulty in walking, not elsewhere classified: Secondary | ICD-10-CM | POA: Diagnosis not present

## 2015-09-06 DIAGNOSIS — M6281 Muscle weakness (generalized): Secondary | ICD-10-CM | POA: Diagnosis not present

## 2015-09-06 DIAGNOSIS — M545 Low back pain: Secondary | ICD-10-CM | POA: Diagnosis not present

## 2015-09-08 DIAGNOSIS — M542 Cervicalgia: Secondary | ICD-10-CM | POA: Diagnosis not present

## 2015-09-08 DIAGNOSIS — M6281 Muscle weakness (generalized): Secondary | ICD-10-CM | POA: Diagnosis not present

## 2015-09-08 DIAGNOSIS — R262 Difficulty in walking, not elsewhere classified: Secondary | ICD-10-CM | POA: Diagnosis not present

## 2015-09-08 DIAGNOSIS — M545 Low back pain: Secondary | ICD-10-CM | POA: Diagnosis not present

## 2015-09-20 DIAGNOSIS — M542 Cervicalgia: Secondary | ICD-10-CM | POA: Diagnosis not present

## 2015-09-20 DIAGNOSIS — M545 Low back pain: Secondary | ICD-10-CM | POA: Diagnosis not present

## 2015-09-20 DIAGNOSIS — M6281 Muscle weakness (generalized): Secondary | ICD-10-CM | POA: Diagnosis not present

## 2015-09-20 DIAGNOSIS — R262 Difficulty in walking, not elsewhere classified: Secondary | ICD-10-CM | POA: Diagnosis not present

## 2015-09-22 DIAGNOSIS — M542 Cervicalgia: Secondary | ICD-10-CM | POA: Diagnosis not present

## 2015-09-22 DIAGNOSIS — M6281 Muscle weakness (generalized): Secondary | ICD-10-CM | POA: Diagnosis not present

## 2015-09-22 DIAGNOSIS — R262 Difficulty in walking, not elsewhere classified: Secondary | ICD-10-CM | POA: Diagnosis not present

## 2015-09-22 DIAGNOSIS — M545 Low back pain: Secondary | ICD-10-CM | POA: Diagnosis not present

## 2015-09-29 DIAGNOSIS — R262 Difficulty in walking, not elsewhere classified: Secondary | ICD-10-CM | POA: Diagnosis not present

## 2015-09-29 DIAGNOSIS — M6281 Muscle weakness (generalized): Secondary | ICD-10-CM | POA: Diagnosis not present

## 2015-09-29 DIAGNOSIS — M545 Low back pain: Secondary | ICD-10-CM | POA: Diagnosis not present

## 2015-09-29 DIAGNOSIS — M542 Cervicalgia: Secondary | ICD-10-CM | POA: Diagnosis not present

## 2015-10-04 DIAGNOSIS — M545 Low back pain: Secondary | ICD-10-CM | POA: Diagnosis not present

## 2015-10-04 DIAGNOSIS — M6281 Muscle weakness (generalized): Secondary | ICD-10-CM | POA: Diagnosis not present

## 2015-10-04 DIAGNOSIS — M542 Cervicalgia: Secondary | ICD-10-CM | POA: Diagnosis not present

## 2015-10-04 DIAGNOSIS — R262 Difficulty in walking, not elsewhere classified: Secondary | ICD-10-CM | POA: Diagnosis not present

## 2015-10-06 DIAGNOSIS — M545 Low back pain: Secondary | ICD-10-CM | POA: Diagnosis not present

## 2015-10-06 DIAGNOSIS — R262 Difficulty in walking, not elsewhere classified: Secondary | ICD-10-CM | POA: Diagnosis not present

## 2015-10-06 DIAGNOSIS — M542 Cervicalgia: Secondary | ICD-10-CM | POA: Diagnosis not present

## 2015-10-06 DIAGNOSIS — M6281 Muscle weakness (generalized): Secondary | ICD-10-CM | POA: Diagnosis not present

## 2015-10-11 DIAGNOSIS — M810 Age-related osteoporosis without current pathological fracture: Secondary | ICD-10-CM | POA: Diagnosis not present

## 2015-10-11 DIAGNOSIS — I10 Essential (primary) hypertension: Secondary | ICD-10-CM | POA: Diagnosis not present

## 2015-10-11 DIAGNOSIS — E782 Mixed hyperlipidemia: Secondary | ICD-10-CM | POA: Diagnosis not present

## 2015-10-17 DIAGNOSIS — R262 Difficulty in walking, not elsewhere classified: Secondary | ICD-10-CM | POA: Diagnosis not present

## 2015-10-17 DIAGNOSIS — M542 Cervicalgia: Secondary | ICD-10-CM | POA: Diagnosis not present

## 2015-10-17 DIAGNOSIS — M6281 Muscle weakness (generalized): Secondary | ICD-10-CM | POA: Diagnosis not present

## 2015-10-17 DIAGNOSIS — M545 Low back pain: Secondary | ICD-10-CM | POA: Diagnosis not present

## 2015-10-18 DIAGNOSIS — I10 Essential (primary) hypertension: Secondary | ICD-10-CM | POA: Diagnosis not present

## 2015-10-18 DIAGNOSIS — E782 Mixed hyperlipidemia: Secondary | ICD-10-CM | POA: Diagnosis not present

## 2015-10-18 DIAGNOSIS — R5383 Other fatigue: Secondary | ICD-10-CM | POA: Diagnosis not present

## 2015-10-18 DIAGNOSIS — R2681 Unsteadiness on feet: Secondary | ICD-10-CM | POA: Diagnosis not present

## 2015-10-18 DIAGNOSIS — Z0000 Encounter for general adult medical examination without abnormal findings: Secondary | ICD-10-CM | POA: Diagnosis not present

## 2015-10-18 DIAGNOSIS — Z6825 Body mass index (BMI) 25.0-25.9, adult: Secondary | ICD-10-CM | POA: Diagnosis not present

## 2015-10-18 DIAGNOSIS — M4184 Other forms of scoliosis, thoracic region: Secondary | ICD-10-CM | POA: Diagnosis not present

## 2015-10-18 DIAGNOSIS — M810 Age-related osteoporosis without current pathological fracture: Secondary | ICD-10-CM | POA: Diagnosis not present

## 2015-10-18 DIAGNOSIS — M4120 Other idiopathic scoliosis, site unspecified: Secondary | ICD-10-CM | POA: Diagnosis not present

## 2015-10-18 DIAGNOSIS — Z23 Encounter for immunization: Secondary | ICD-10-CM | POA: Diagnosis not present

## 2015-10-18 DIAGNOSIS — Z1389 Encounter for screening for other disorder: Secondary | ICD-10-CM | POA: Diagnosis not present

## 2015-10-19 DIAGNOSIS — M542 Cervicalgia: Secondary | ICD-10-CM | POA: Diagnosis not present

## 2015-10-19 DIAGNOSIS — M545 Low back pain: Secondary | ICD-10-CM | POA: Diagnosis not present

## 2015-10-19 DIAGNOSIS — R262 Difficulty in walking, not elsewhere classified: Secondary | ICD-10-CM | POA: Diagnosis not present

## 2015-10-19 DIAGNOSIS — M6281 Muscle weakness (generalized): Secondary | ICD-10-CM | POA: Diagnosis not present

## 2015-10-27 DIAGNOSIS — R262 Difficulty in walking, not elsewhere classified: Secondary | ICD-10-CM | POA: Diagnosis not present

## 2015-10-27 DIAGNOSIS — M6281 Muscle weakness (generalized): Secondary | ICD-10-CM | POA: Diagnosis not present

## 2015-10-27 DIAGNOSIS — M545 Low back pain: Secondary | ICD-10-CM | POA: Diagnosis not present

## 2015-10-27 DIAGNOSIS — M542 Cervicalgia: Secondary | ICD-10-CM | POA: Diagnosis not present

## 2015-10-31 DIAGNOSIS — M4325 Fusion of spine, thoracolumbar region: Secondary | ICD-10-CM | POA: Diagnosis not present

## 2015-10-31 DIAGNOSIS — Z981 Arthrodesis status: Secondary | ICD-10-CM | POA: Diagnosis not present

## 2015-10-31 DIAGNOSIS — M549 Dorsalgia, unspecified: Secondary | ICD-10-CM | POA: Diagnosis not present

## 2015-11-02 DIAGNOSIS — M6281 Muscle weakness (generalized): Secondary | ICD-10-CM | POA: Diagnosis not present

## 2015-11-02 DIAGNOSIS — M545 Low back pain: Secondary | ICD-10-CM | POA: Diagnosis not present

## 2015-11-02 DIAGNOSIS — M542 Cervicalgia: Secondary | ICD-10-CM | POA: Diagnosis not present

## 2015-11-02 DIAGNOSIS — R262 Difficulty in walking, not elsewhere classified: Secondary | ICD-10-CM | POA: Diagnosis not present

## 2015-11-10 DIAGNOSIS — M545 Low back pain: Secondary | ICD-10-CM | POA: Diagnosis not present

## 2015-11-10 DIAGNOSIS — M542 Cervicalgia: Secondary | ICD-10-CM | POA: Diagnosis not present

## 2015-11-10 DIAGNOSIS — R262 Difficulty in walking, not elsewhere classified: Secondary | ICD-10-CM | POA: Diagnosis not present

## 2015-11-10 DIAGNOSIS — M6281 Muscle weakness (generalized): Secondary | ICD-10-CM | POA: Diagnosis not present

## 2015-11-15 DIAGNOSIS — M6281 Muscle weakness (generalized): Secondary | ICD-10-CM | POA: Diagnosis not present

## 2015-11-15 DIAGNOSIS — M542 Cervicalgia: Secondary | ICD-10-CM | POA: Diagnosis not present

## 2015-11-15 DIAGNOSIS — M545 Low back pain: Secondary | ICD-10-CM | POA: Diagnosis not present

## 2015-11-15 DIAGNOSIS — R262 Difficulty in walking, not elsewhere classified: Secondary | ICD-10-CM | POA: Diagnosis not present

## 2016-07-15 IMAGING — CT CT ABD-PELV W/ CM
2 of 5 series · 14 of 46 positions shown, 16 images · IV contrast (ISOVUE)
Comparison: MRI of the lumbar spine performed 07/04/2015, and CT of
the lumbar spine performed 11/09/2014

CLINICAL DATA: Acute onset of intermittent generalized abdominal
cramping, with constipation. Initial encounter.

EXAM:
CT ABDOMEN AND PELVIS WITH CONTRAST
TECHNIQUE: Multidetector CT imaging of the abdomen and pelvis was performed
using the standard protocol following bolus administration of
intravenous contrast.
CONTRAST:  100mL [id number] IOPAMIDOL ([id number]) INJECTION 61%

[Series 2: abd/pel with · axial · 0.74mm/px · z∈[+948,+1338]mm · 11 of 90 slices shown, 13 images]
[im 6/90  soft-tissue]
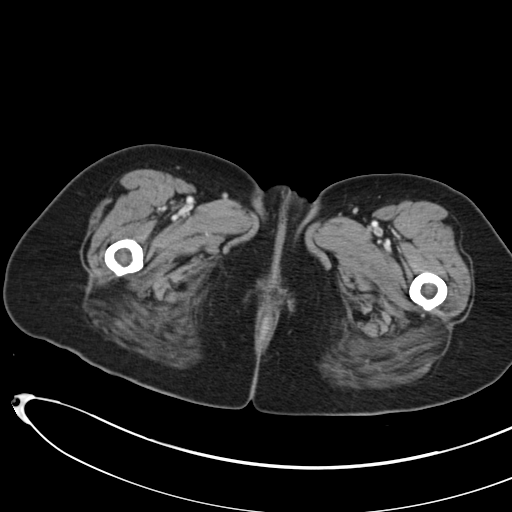
[im 6/90  bone]
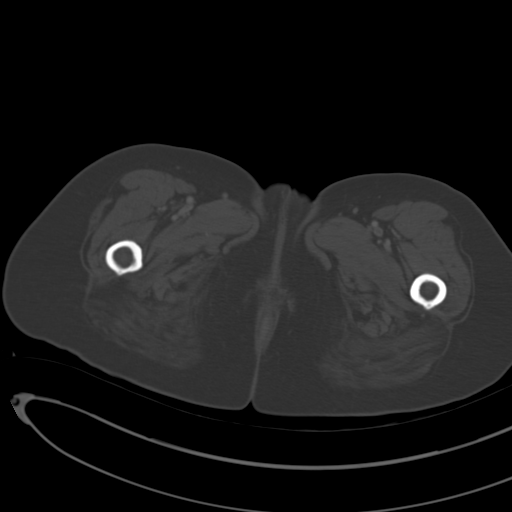
[im 12/90  soft-tissue]
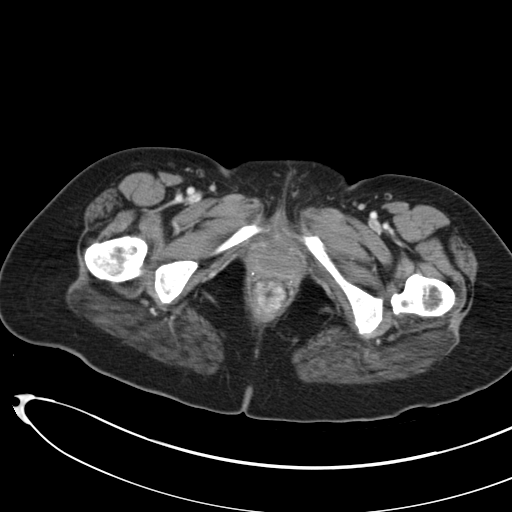
[im 24/90  soft-tissue]
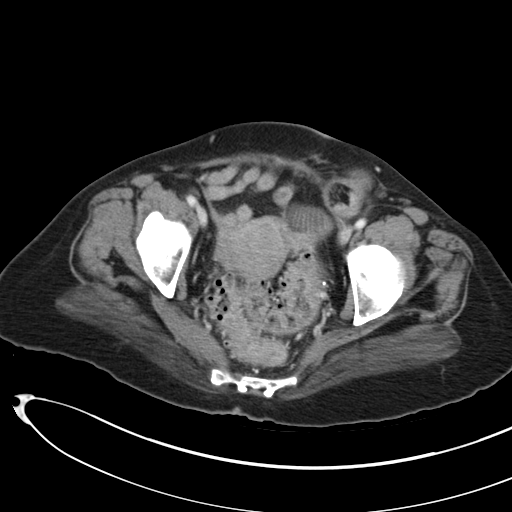
[im 30/90  soft-tissue]
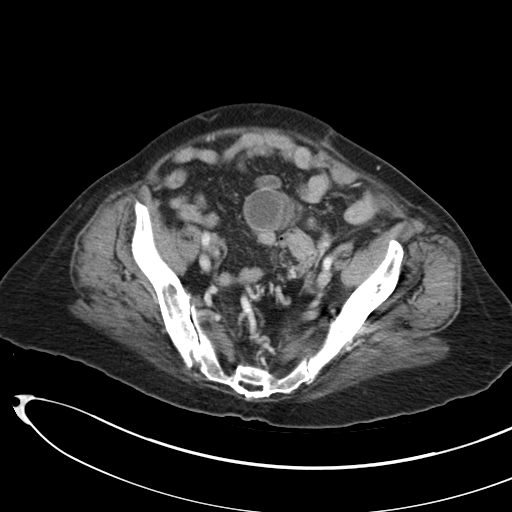
[im 36/90  soft-tissue]
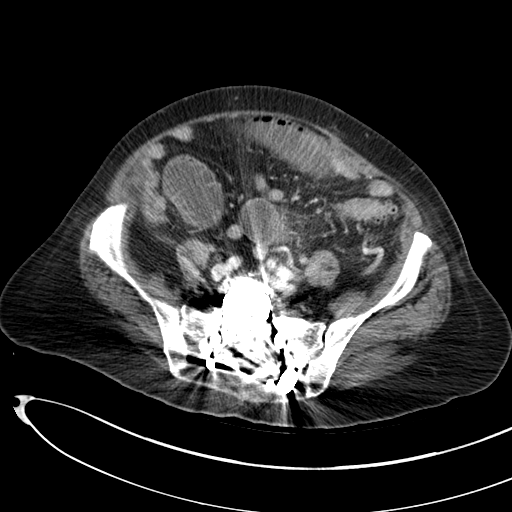
[im 48/90  soft-tissue]
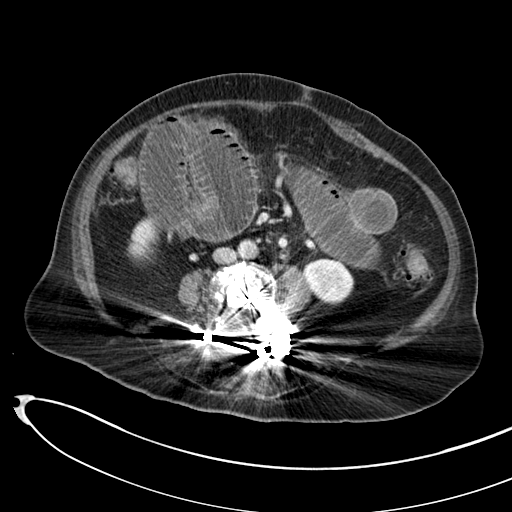
[im 54/90  soft-tissue]
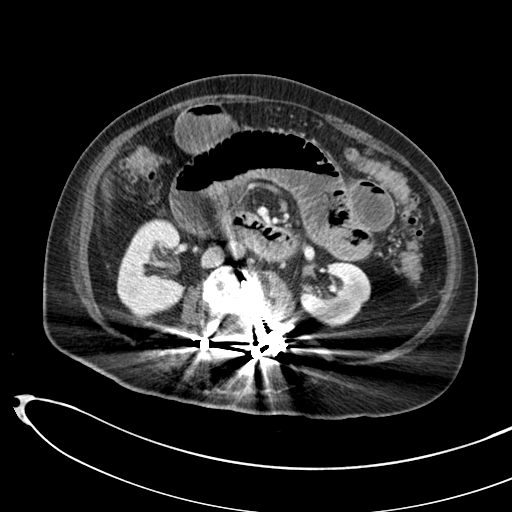
[im 60/90  soft-tissue]
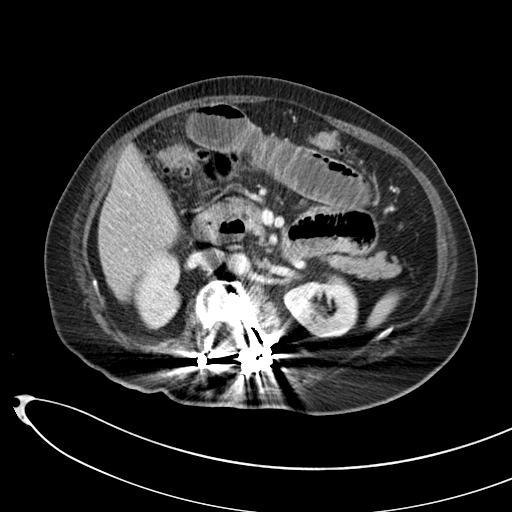
[im 66/90  soft-tissue]
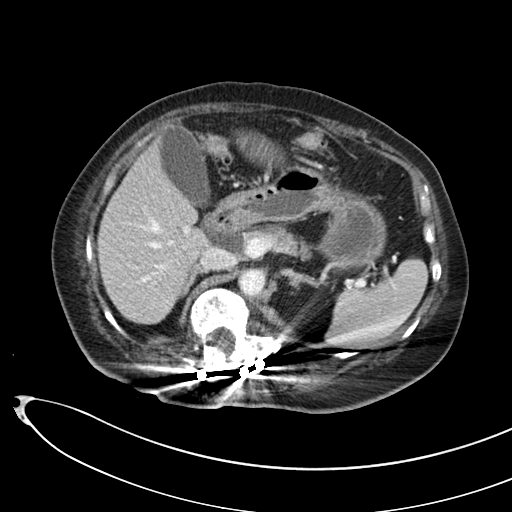
[im 66/90  bone]
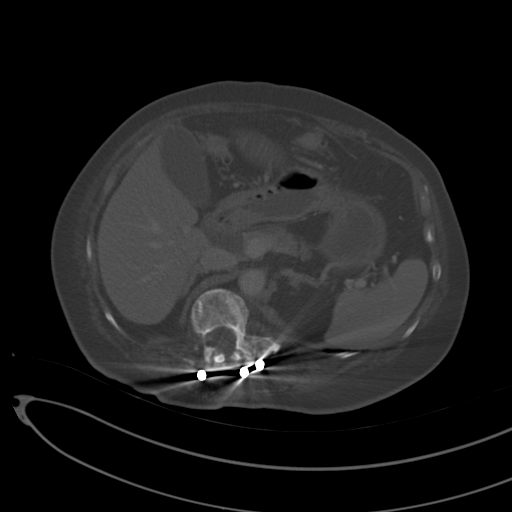
[im 78/90  soft-tissue]
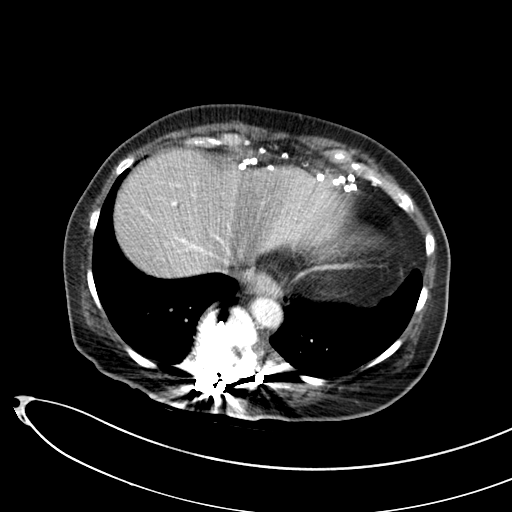
[im 84/90  soft-tissue]
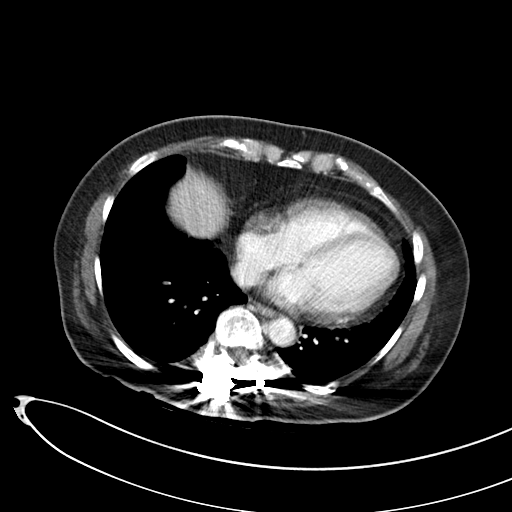

[Series 6: coronal a/|p · coronal · 0.68mm/px · 3 of 129 slices shown]
[im 43/129  soft-tissue]
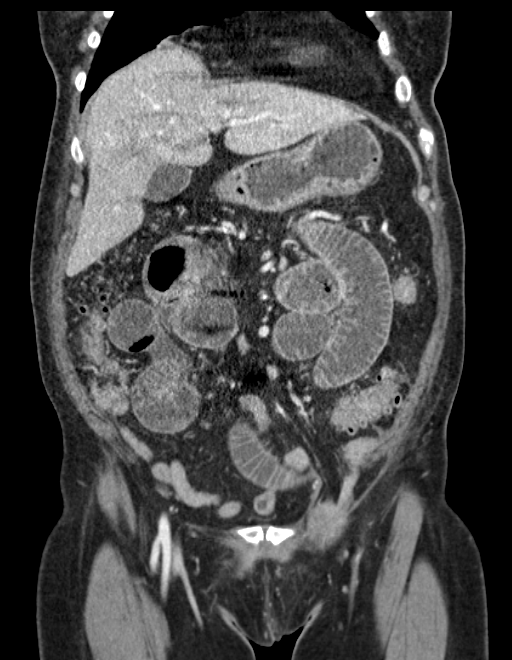
[im 57/129  soft-tissue]
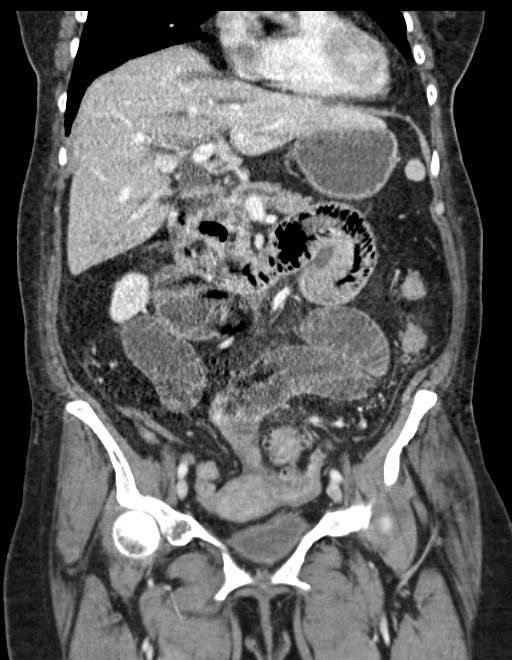
[im 72/129  soft-tissue]
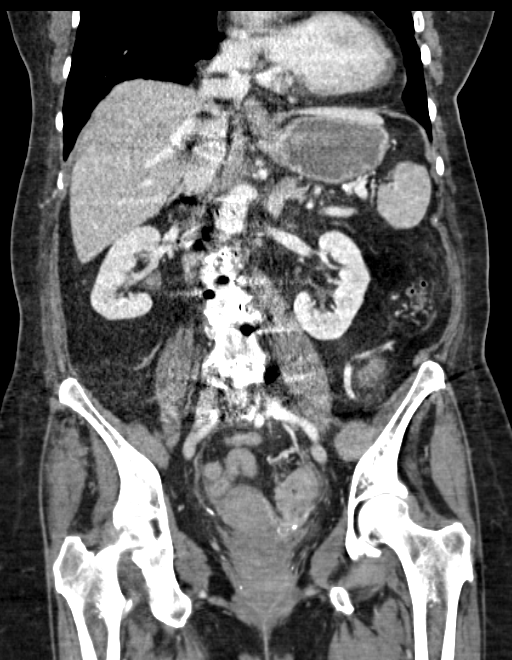

[14 of 46 positions shown; findings below may reference images not displayed]

FINDINGS: The visualized lung bases are clear. The stomach is partially filled
with fluid and is unremarkable in appearance. An anterior abdominal
wall mesh is noted at the upper abdomen.

There is dilatation of small bowel loops up to 4.5 cm in maximal
diameter, to the level of herniation of a short segment of proximal
to mid ileum into a small left inguinal hernia, with associated
high-grade obstruction. The herniated loop is mildly dilated, with
trace surrounding fluid. More distal small bowel is completely
decompressed. A small amount of inflammation is noted tracking about
the dilated proximal ileal loops.

The liver and spleen are unremarkable in appearance. The gallbladder
is within normal limits. The pancreatic duct is mildly distended,
measuring up to 4 mm. Would correlate with pancreatic lab values.
The adrenal glands are unremarkable.

The kidneys are unremarkable in appearance. There is no evidence of
hydronephrosis. No renal or ureteral stones are seen. No perinephric
stranding is appreciated.

No acute vascular abnormalities are seen.

The appendix is decompressed and unremarkable in appearance. The
colon is largely decompressed, with diffuse diverticulosis noted
along the entirety of the colon. There is fatty infiltration at the
ileocecal junction.

There appears to be mild wall thickening along the mid sigmoid
colon, which could reflect mild acute diverticulitis or mild focal
colitis. There is prominent enhancement about the rectum, raising
concern for internal hemorrhoids.

The bladder is mildly distended and grossly remarkable. The uterus
is unremarkable in appearance. The ovaries are relatively symmetric.
No suspicious adnexal masses are seen. No inguinal lymphadenopathy
is seen.

No acute osseous abnormalities are identified. Thoracolumbar spinal
fusion hardware is noted, with underlying decompression. Diffuse
underlying degenerative change is seen. There appears to be a
chronic displaced fracture at the inferior edge of S1, with just
over 1 cm of anterior and inferior displacement of S1 in relation to
S2, with associated angulation; this is grossly stable from prior
studies.
IMPRESSION: 1. Relatively high-grade small bowel obstruction noted, with
dilatation of small-bowel loops to 4.5 cm in maximal diameter. This
reflects an obstructing small left inguinal hernia, containing a
short segment of dilated proximal to mid ileum. Small amount of
associated fluid noted within the hernia. Mild inflammation seen
tracking about the dilated proximal ileal loops. Distal small bowel
loops are decompressed.
2. Mild wall thickening along the mid sigmoid colon could reflect
mild acute diverticulitis or mild focal colitis.
3. Prominent enhancement about the rectum raises concern for
internal hemorrhoids.
4. Pancreatic duct is mildly distended, measuring up to 4 mm. Would
correlate with pancreatic lab values.
5. Chronic displaced fracture at the inferior edge of S1, with just
over 1 cm anterior and inferior displacement of S1 in relation to
S2, with associated angulation. This is grossly stable from prior
studies.

## 2016-07-26 DIAGNOSIS — Z09 Encounter for follow-up examination after completed treatment for conditions other than malignant neoplasm: Secondary | ICD-10-CM | POA: Diagnosis not present

## 2016-07-26 DIAGNOSIS — Z981 Arthrodesis status: Secondary | ICD-10-CM | POA: Diagnosis not present

## 2016-07-26 DIAGNOSIS — K439 Ventral hernia without obstruction or gangrene: Secondary | ICD-10-CM | POA: Diagnosis not present

## 2016-07-26 DIAGNOSIS — M4325 Fusion of spine, thoracolumbar region: Secondary | ICD-10-CM | POA: Diagnosis not present

## 2016-07-26 DIAGNOSIS — M4316 Spondylolisthesis, lumbar region: Secondary | ICD-10-CM | POA: Diagnosis not present

## 2016-07-26 DIAGNOSIS — Z8739 Personal history of other diseases of the musculoskeletal system and connective tissue: Secondary | ICD-10-CM | POA: Diagnosis not present

## 2016-07-26 DIAGNOSIS — M9669 Fracture of other bone following insertion of orthopedic implant, joint prosthesis, or bone plate: Secondary | ICD-10-CM | POA: Diagnosis not present

## 2016-10-16 DIAGNOSIS — M810 Age-related osteoporosis without current pathological fracture: Secondary | ICD-10-CM | POA: Diagnosis not present

## 2016-10-16 DIAGNOSIS — R8299 Other abnormal findings in urine: Secondary | ICD-10-CM | POA: Diagnosis not present

## 2016-10-16 DIAGNOSIS — N390 Urinary tract infection, site not specified: Secondary | ICD-10-CM | POA: Diagnosis not present

## 2016-10-16 DIAGNOSIS — E782 Mixed hyperlipidemia: Secondary | ICD-10-CM | POA: Diagnosis not present

## 2016-10-16 DIAGNOSIS — I10 Essential (primary) hypertension: Secondary | ICD-10-CM | POA: Diagnosis not present

## 2016-10-23 DIAGNOSIS — Z6826 Body mass index (BMI) 26.0-26.9, adult: Secondary | ICD-10-CM | POA: Diagnosis not present

## 2016-10-23 DIAGNOSIS — M810 Age-related osteoporosis without current pathological fracture: Secondary | ICD-10-CM | POA: Diagnosis not present

## 2016-10-23 DIAGNOSIS — M545 Low back pain: Secondary | ICD-10-CM | POA: Diagnosis not present

## 2016-10-23 DIAGNOSIS — Z0000 Encounter for general adult medical examination without abnormal findings: Secondary | ICD-10-CM | POA: Diagnosis not present

## 2016-10-23 DIAGNOSIS — G4709 Other insomnia: Secondary | ICD-10-CM | POA: Diagnosis not present

## 2016-10-23 DIAGNOSIS — I10 Essential (primary) hypertension: Secondary | ICD-10-CM | POA: Diagnosis not present

## 2016-10-23 DIAGNOSIS — Z23 Encounter for immunization: Secondary | ICD-10-CM | POA: Diagnosis not present

## 2016-10-23 DIAGNOSIS — E782 Mixed hyperlipidemia: Secondary | ICD-10-CM | POA: Diagnosis not present

## 2016-10-23 DIAGNOSIS — R5383 Other fatigue: Secondary | ICD-10-CM | POA: Diagnosis not present

## 2016-10-23 DIAGNOSIS — R2681 Unsteadiness on feet: Secondary | ICD-10-CM | POA: Diagnosis not present

## 2016-10-23 DIAGNOSIS — Z1389 Encounter for screening for other disorder: Secondary | ICD-10-CM | POA: Diagnosis not present

## 2016-11-21 DIAGNOSIS — Z6827 Body mass index (BMI) 27.0-27.9, adult: Secondary | ICD-10-CM | POA: Diagnosis not present

## 2016-11-21 DIAGNOSIS — K921 Melena: Secondary | ICD-10-CM | POA: Diagnosis not present

## 2017-03-05 DIAGNOSIS — R2681 Unsteadiness on feet: Secondary | ICD-10-CM | POA: Diagnosis not present

## 2017-03-05 DIAGNOSIS — Z6827 Body mass index (BMI) 27.0-27.9, adult: Secondary | ICD-10-CM | POA: Diagnosis not present

## 2017-03-05 DIAGNOSIS — M545 Low back pain: Secondary | ICD-10-CM | POA: Diagnosis not present

## 2017-03-05 DIAGNOSIS — M810 Age-related osteoporosis without current pathological fracture: Secondary | ICD-10-CM | POA: Diagnosis not present

## 2017-03-05 DIAGNOSIS — F418 Other specified anxiety disorders: Secondary | ICD-10-CM | POA: Diagnosis not present

## 2017-03-05 DIAGNOSIS — Z1389 Encounter for screening for other disorder: Secondary | ICD-10-CM | POA: Diagnosis not present

## 2017-03-28 ENCOUNTER — Encounter (INDEPENDENT_AMBULATORY_CARE_PROVIDER_SITE_OTHER): Payer: Self-pay | Admitting: Physical Medicine and Rehabilitation

## 2017-03-28 ENCOUNTER — Ambulatory Visit (INDEPENDENT_AMBULATORY_CARE_PROVIDER_SITE_OTHER): Payer: Medicare Other | Admitting: Physical Medicine and Rehabilitation

## 2017-03-28 VITALS — BP 141/91 | HR 73

## 2017-03-28 DIAGNOSIS — G894 Chronic pain syndrome: Secondary | ICD-10-CM | POA: Diagnosis not present

## 2017-03-28 DIAGNOSIS — M961 Postlaminectomy syndrome, not elsewhere classified: Secondary | ICD-10-CM | POA: Diagnosis not present

## 2017-03-28 DIAGNOSIS — M545 Low back pain, unspecified: Secondary | ICD-10-CM

## 2017-03-28 DIAGNOSIS — G8929 Other chronic pain: Secondary | ICD-10-CM | POA: Diagnosis not present

## 2017-03-28 DIAGNOSIS — M419 Scoliosis, unspecified: Secondary | ICD-10-CM | POA: Diagnosis not present

## 2017-03-28 NOTE — Progress Notes (Deleted)
Lower back pain- multiple back surgeries- [facility]. No numbness or tingling. Sometimes has leg cramps at night. Right side is usually more painful. Sitting relieves pain. Pain increases with standing and walking. Unable to stand up straight.

## 2017-04-10 DIAGNOSIS — M542 Cervicalgia: Secondary | ICD-10-CM | POA: Diagnosis not present

## 2017-04-10 DIAGNOSIS — M25552 Pain in left hip: Secondary | ICD-10-CM | POA: Diagnosis not present

## 2017-04-10 DIAGNOSIS — M545 Low back pain: Secondary | ICD-10-CM | POA: Diagnosis not present

## 2017-04-10 DIAGNOSIS — M25551 Pain in right hip: Secondary | ICD-10-CM | POA: Diagnosis not present

## 2017-04-19 DIAGNOSIS — M25551 Pain in right hip: Secondary | ICD-10-CM | POA: Diagnosis not present

## 2017-04-19 DIAGNOSIS — M542 Cervicalgia: Secondary | ICD-10-CM | POA: Diagnosis not present

## 2017-04-19 DIAGNOSIS — M545 Low back pain: Secondary | ICD-10-CM | POA: Diagnosis not present

## 2017-04-19 DIAGNOSIS — M25552 Pain in left hip: Secondary | ICD-10-CM | POA: Diagnosis not present

## 2017-04-24 ENCOUNTER — Ambulatory Visit (INDEPENDENT_AMBULATORY_CARE_PROVIDER_SITE_OTHER): Payer: Medicare Other | Admitting: Physical Medicine and Rehabilitation

## 2017-04-24 DIAGNOSIS — Z1231 Encounter for screening mammogram for malignant neoplasm of breast: Secondary | ICD-10-CM | POA: Diagnosis not present

## 2017-04-24 DIAGNOSIS — Z6826 Body mass index (BMI) 26.0-26.9, adult: Secondary | ICD-10-CM | POA: Diagnosis not present

## 2017-04-24 DIAGNOSIS — Z01419 Encounter for gynecological examination (general) (routine) without abnormal findings: Secondary | ICD-10-CM | POA: Diagnosis not present

## 2017-04-24 DIAGNOSIS — N952 Postmenopausal atrophic vaginitis: Secondary | ICD-10-CM | POA: Diagnosis not present

## 2017-04-25 DIAGNOSIS — M25551 Pain in right hip: Secondary | ICD-10-CM | POA: Diagnosis not present

## 2017-04-25 DIAGNOSIS — M25552 Pain in left hip: Secondary | ICD-10-CM | POA: Diagnosis not present

## 2017-04-25 DIAGNOSIS — M542 Cervicalgia: Secondary | ICD-10-CM | POA: Diagnosis not present

## 2017-04-25 DIAGNOSIS — M545 Low back pain: Secondary | ICD-10-CM | POA: Diagnosis not present

## 2017-04-29 DIAGNOSIS — M542 Cervicalgia: Secondary | ICD-10-CM | POA: Diagnosis not present

## 2017-04-29 DIAGNOSIS — M545 Low back pain: Secondary | ICD-10-CM | POA: Diagnosis not present

## 2017-04-29 DIAGNOSIS — M25552 Pain in left hip: Secondary | ICD-10-CM | POA: Diagnosis not present

## 2017-04-29 DIAGNOSIS — M25551 Pain in right hip: Secondary | ICD-10-CM | POA: Diagnosis not present

## 2017-05-01 DIAGNOSIS — M545 Low back pain: Secondary | ICD-10-CM | POA: Diagnosis not present

## 2017-05-01 DIAGNOSIS — M25552 Pain in left hip: Secondary | ICD-10-CM | POA: Diagnosis not present

## 2017-05-01 DIAGNOSIS — M25551 Pain in right hip: Secondary | ICD-10-CM | POA: Diagnosis not present

## 2017-05-01 DIAGNOSIS — M542 Cervicalgia: Secondary | ICD-10-CM | POA: Diagnosis not present

## 2017-05-08 DIAGNOSIS — M542 Cervicalgia: Secondary | ICD-10-CM | POA: Diagnosis not present

## 2017-05-08 DIAGNOSIS — M25551 Pain in right hip: Secondary | ICD-10-CM | POA: Diagnosis not present

## 2017-05-08 DIAGNOSIS — M25552 Pain in left hip: Secondary | ICD-10-CM | POA: Diagnosis not present

## 2017-05-08 DIAGNOSIS — M545 Low back pain: Secondary | ICD-10-CM | POA: Diagnosis not present

## 2017-05-15 DIAGNOSIS — M25552 Pain in left hip: Secondary | ICD-10-CM | POA: Diagnosis not present

## 2017-05-15 DIAGNOSIS — M25551 Pain in right hip: Secondary | ICD-10-CM | POA: Diagnosis not present

## 2017-05-15 DIAGNOSIS — M545 Low back pain: Secondary | ICD-10-CM | POA: Diagnosis not present

## 2017-05-15 DIAGNOSIS — M542 Cervicalgia: Secondary | ICD-10-CM | POA: Diagnosis not present

## 2017-05-20 ENCOUNTER — Encounter (INDEPENDENT_AMBULATORY_CARE_PROVIDER_SITE_OTHER): Payer: Self-pay | Admitting: Physical Medicine and Rehabilitation

## 2017-05-20 NOTE — Progress Notes (Signed)
Deborah Sellers - 72 y.o. female MRN [id number]  Date of birth: 25-Jun-1945  Office Visit Note: Visit Date: 03/28/2017 PCP: Jarome Matin, MD Referred by: Jarome Matin, MD  Subjective: Chief Complaint  Patient presents with  . Lower Back - Pain   HPI: Deborah Sellers is a very pleasant 72 year old female who comes in today at the request of Dr. Jarome Matin for evaluation and possible management of her spine pain and dysfunction.  There is a patient of Dr. Norval Gable notes as well as Dr. Jonell Cluck notes from [facility] for a complicated picture of overall spine pathology develops.  The patient reports multiple back surgeries and continued low back pain.  She reports her last 2 surgeries were performed at [facility].  Initial lumbar fusion was performed by Dr. Dutch Quint at [facility]. She denies any numbness or tingling in the legs but sometimes has leg cramping at night.  She reports the right side is more painful when she points to an area really is about the L4 region are a little higher.  She reports that sitting does relieve her pain.  She reports pain increases with standing and walking.  Her biggest complaint is being able to stand and walk but only if leaning forward and she is unable to stand up straight.  It appears that sometime in the middle of 2017 she was about a year out from T2 to sacral fusion for scoliosis and other issues.  At that time she began having some difficulty walking and ambulating with gait problems.  She had therapy and then ultimately had some imaging done that showed failure of the fusion and prior Harrington rods.  She was found to have cord compression at 2 different spots likely causing the gait abnormality.  She then underwent C2 through pelvic fusion.  Last x-ray from [facility] which is fairly recent as described below.  After the last surgery which was performed in May 2017 she did continue to have physical therapy for her gait imbalance and  this is improved quite significantly.  She does ambulate without aid.  She again reports her biggest issue is standing up straight and trying to walk for any length of time and is mainly a mechanical aching back pain on the right.  She denies any groin pain of the hips.  She has had no focal weakness.  She continues to use Duragesic patch but no other pain medications.  She has not had recent physical therapy.   Barb Merino, MD     Review of Systems  Constitutional: Negative for chills, fever, malaise/fatigue and weight loss.  HENT: Negative for hearing loss and sinus pain.   Eyes: Negative for blurred vision, double vision and photophobia.  Respiratory: Negative for cough and shortness of breath.   Cardiovascular: Negative for chest pain, palpitations and leg swelling.  Gastrointestinal: Negative for abdominal pain, nausea and vomiting.  Genitourinary: Negative for flank pain.  Musculoskeletal: Positive for back pain. Negative for myalgias.  Skin: Negative for itching and rash.  Neurological: Negative for tremors, focal weakness and weakness.  Endo/Heme/Allergies: Negative.   Psychiatric/Behavioral: Negative for depression.  All other systems reviewed and are negative.  Otherwise per HPI.  Assessment & Plan: Visit Diagnoses:  1. Chronic bilateral low back pain without sciatica   2. Scoliosis of thoracolumbar spine, unspecified scoliosis type   3. Post laminectomy syndrome   4. Chronic pain syndrome     Plan: Findings:  Unfortunately for Deborah Sellers she represents  a very complicated spine patient with multiple surgeries and multiple surgical failure unfortunately.  The last surgery is essentially a fusion from C2 down to the pelvis.  She has had good relief of her potential myelopathic issues with possible cord compression on the last MRI that was performed and is done well from a gait standpoint and strength standpoint which is really a very good outcome in that situation.   Unfortunately her biggest complaint is trying to walk more upright and she does have pain on the right side and it is almost mechanical muscular pain.  This is not really relieved with Duragesic patch.  She is on a small amount of Neurontin.  She really denies radicular complaints.  We had a fairly long discussion today about treatment options which are fairly limited.  In the past she had good results with physical therapy at [facility] physical therapy.  That group I have used extensively over the time that I have been in [place] and I do feel like they do a great job.  I want her to regroup with her therapist there that she has seen in the past.  We have written a prescription today.  I think mechanical soft tissue work and possibly dry needling may benefit her.  I think if she can relieve some of the muscle pain and tightness she may achieve some more upright stature but mechanically with her whole spine fused down even to the pelvis the only degree of freedom she has at this point is at the hips.  I am fearful that at some point she may start to develop more hip arthritis.  I would have him also look at hip flexibility exercises for her.  I think the only degree of rotation she is going to get is probably at the hips.  I told her I would be happy to follow along with her but there is really not a lot we can do from my standpoint other than may be a deeper trigger point type injection at some point we could identify any particular muscle.  I think her best work is going to be with just extensive work on exercises and trying to stay active.  She actually is doing remarkably well given the amount of surgery she has had.    Meds & Orders: No orders of the defined types were placed in this encounter.  No orders of the defined types were placed in this encounter.   Follow-up: Return if symptoms worsen or fail to improve.   Procedures: No procedures performed  No notes on file   Clinical  History: RADIOGRAPHS OF THE ENTIRE THORACIC AND LUMBAR SPINE - 2 views, 6 images.  INDICATION: pain, M43.25 Fusion of spine, thoracolumbar region.   COMPARISON: 10/31/2015  FINDINGS/IMPRESSION:   Frontal and lateral images of the thoracolumbar spine were acquired; upper and lower images are appropriately fused.  Film Quality: Adequate for interpretation, however technique limits sensitivity for detection of small nondisplaced fractures. The cervicothoracic junction is underpenetrated on lateral view and suboptimally evaluated.  Bones: Severe generalized osteopenia limits the sensitivity of this exam. Mild anterior wedging of multiple thoracic vertebral bodies, similar to prior.  Alignment: Positive sagittal imbalance. Overall alignment is unchanged from prior exam. Stable anterolisthesis L5 on S1.  Hardware: C2-S1 posterior fusion with left-sided iliac screw, which is fractured, unchanged from prior. Interbody spacers at L1-S1 with anterior fusion L5-S1. Discontinuity of a right sided vertical rod at the level of T6-T7, unchanged from prior. Disc continuity  of a left-sided vertical rod at the level of T7-T8, unchanged from prior.  Degenerative changes: Multilevel degenerative disc disease throughout the thoracic and lumbar spine.  Soft tissues: No evidence of acute soft tissue abnormalities. Postsurgical changes of anterior abdominal wall hernia repair.  Please note that this examination is designed to assess spinal alignment; if detailed evaluation of the thoracic and/or lumbar spine is desired, recommend dedicated thoracic spine and/or lumbar spine radiographic studies.   MRI THORACIC AND LUMBAR SPINE WITHOUT CONTRAST  TECHNIQUE: Multiplanar and multiecho pulse sequences of the thoracic and lumbar spine were obtained without intravenous contrast.  COMPARISON: CT scans of the thoracic and lumbar spine dated 11/09/2014  FINDINGS: MR THORACIC SPINE FINDINGS  The  thoracic spine is fused from T2 through T12-L1. The patient has developed a 6 mm spondylolisthesis of T1 on T2 since the prior CT scan of 11/09/2014. There is severe degenerative disc disease at that level. This could create severe spinal stenosis and spinal cord compression. However, the metallic artifacts from the pedicle screws in T2 obscured the detail at that level.  The scout image 4 of series 3 appears to demonstrate compression of the cervical spinal cord at C5-6 due to osteophytes.  Spinal cord otherwise appears normal throughout the thoracic spine. Some detail is obscured by the pedicle screws and Harrington rods. There is no discrete spinal or foraminal stenosis or abnormality of the paraspinal soft tissues.  There is cardiomegaly which is chronic.  MR LUMBAR SPINE FINDINGS  The lumbar spine is fused from T12-L1 through L5-S1. Fusions are solid from L1 through L5-S1. There is no disc bulging or protrusion. No spinal or foraminal stenosis. Expected scarring at the operative sites. Anterolisthesis of S1 on S2 secondary to prior S1 fracture. This is healed but there is increased subluxation as compared to the prior CT scan. No focal neural impingement.  Incidental note of extensive diverticulosis of the distal colon.  IMPRESSION: MR THORACIC SPINE IMPRESSION  1. New 6 mm spondylolisthesis of the T1 on T2 which could create compression of the thoracic spinal cord. Spinal cord is obscured by metallic artifact at that level. 2. Possible spinal cord compression by a endplate osteophytes at C5-6.  MR LUMBAR SPINE IMPRESSION  Solid fusion from L1 through 2 S1. Increased anterolisthesis of S1 on S2 due to a prior sacral fracture.   Electronically Signed By: Francene BoyersJames Maxwell M.D. On: 07/04/2015 16:48   She reports that she has never smoked. She has never used smokeless tobacco. No results for input(s): HGBA1C, LABURIC in the last 8760 hours.  Objective:  VS:   HT:    WT:   BMI:     BP:(!) 141/91  HR:73bpm  TEMP: ( )  RESP:  Physical Exam  Constitutional: She is oriented to person, place, and time. She appears well-developed and well-nourished. No distress.  HENT:  Head: Normocephalic and atraumatic.  Nose: Nose normal.  Mouth/Throat: Oropharynx is clear and moist.  Eyes: Pupils are equal, round, and reactive to light. Conjunctivae are normal.  Neck: Normal range of motion. Neck supple.  Cardiovascular: Regular rhythm and intact distal pulses.  Pulmonary/Chest: Effort normal. No respiratory distress.  Abdominal: She exhibits no distension. There is no guarding.  Musculoskeletal:  Patient ambulates without aid with a fairly normal gait other than position of the spine relative to the pelvis.  She does walk with a forward flexed gait somewhat rotated to the right.  She is very stiff with extension as would be expected with the  longstem fusion.  She has some tightness of both hips with rotation but no frank groin pain.  She has no real pain over the greater trochanters or PSIS.  She does have some pain on the right with palpation along the paraspinal musculature and latissimus dorsi and even over the iliac crest.  She has good distal strength without clonus.  Neurological: She is alert and oriented to person, place, and time. She exhibits normal muscle tone. Coordination normal.  Skin: Skin is warm. No rash noted. No erythema.  Psychiatric: She has a normal mood and affect. Her behavior is normal.  Nursing note and vitals reviewed.   Ortho Exam Imaging: No results found.  Past Medical/Family/Surgical/Social History: Medications & Allergies reviewed per EMR, new medications updated. Patient Active Problem List   Diagnosis Date Noted  . Diverticulosis of entire colon 08/04/2015  . Hypoxia 08/04/2015  . Tachycardia 08/04/2015  . SBO (small bowel obstruction) (HCC) 08/04/2015  . Chronic constipation 08/04/2015  . Diversion colitis 08/04/2015   . Chronic back pain 08/04/2015  . Essential (primary) hypertension 10/05/2014  . H/O anxiety state 10/05/2014  . Idiopathic scoliosis and kyphoscoliosis 05/13/2014  . Complication of surgical procedure 05/13/2014   Past Medical History:  Diagnosis Date  . Anxiety   . History of blood transfusion    related to surgery with own blood replacement  . Hyperlipidemia   . Hypertension   . Kyphoscoliosis   . Post laminectomy syndrome    Family History  Problem Relation Age of Onset  . Colon cancer Neg Hx   . Esophageal cancer Neg Hx   . Rectal cancer Neg Hx   . Stomach cancer Neg Hx    Past Surgical History:  Procedure Laterality Date  . BACK SURGERY  2007   Dr Jordan Likes, T12-L5  . COLONOSCOPY    . DIAPHRAGMATIC HERNIA REPAIR  2007   Laparoscopic repair of diaphragmatic hernia with PROCEED  . INGUINAL HERNIA REPAIR Left 08/05/2015   Procedure: HERNIA REPAIR INGUINAL ADULT;  Surgeon: Ovidio Kin, MD;  Location: [facility];  Service: General;  Laterality: Left;  . INSERTION OF MESH Left 08/05/2015   Procedure: INSERTION OF MESH;  Surgeon: Ovidio Kin, MD;  Location: [facility];  Service: General;  Laterality: Left;  . POSTERIOR FUSION SPINAL DEFORMITY  9.6.2016   Social History   Occupational History  . Not on file  Tobacco Use  . Smoking status: Never Smoker  . Smokeless tobacco: Never Used  Substance and Sexual Activity  . Alcohol use: Yes    Alcohol/week: 1.8 - 2.4 oz    Types: 3 - 4 Glasses of wine per week  . Drug use: No  . Sexual activity: Not on file

## 2017-05-22 DIAGNOSIS — M542 Cervicalgia: Secondary | ICD-10-CM | POA: Diagnosis not present

## 2017-05-22 DIAGNOSIS — M25551 Pain in right hip: Secondary | ICD-10-CM | POA: Diagnosis not present

## 2017-05-22 DIAGNOSIS — M25552 Pain in left hip: Secondary | ICD-10-CM | POA: Diagnosis not present

## 2017-05-22 DIAGNOSIS — M545 Low back pain: Secondary | ICD-10-CM | POA: Diagnosis not present

## 2017-05-29 DIAGNOSIS — M545 Low back pain: Secondary | ICD-10-CM | POA: Diagnosis not present

## 2017-05-29 DIAGNOSIS — M542 Cervicalgia: Secondary | ICD-10-CM | POA: Diagnosis not present

## 2017-05-29 DIAGNOSIS — M25551 Pain in right hip: Secondary | ICD-10-CM | POA: Diagnosis not present

## 2017-05-29 DIAGNOSIS — M25552 Pain in left hip: Secondary | ICD-10-CM | POA: Diagnosis not present

## 2017-11-14 DIAGNOSIS — I10 Essential (primary) hypertension: Secondary | ICD-10-CM | POA: Diagnosis not present

## 2017-11-14 DIAGNOSIS — R3129 Other microscopic hematuria: Secondary | ICD-10-CM | POA: Diagnosis not present

## 2017-11-14 DIAGNOSIS — M810 Age-related osteoporosis without current pathological fracture: Secondary | ICD-10-CM | POA: Diagnosis not present

## 2017-11-14 DIAGNOSIS — E782 Mixed hyperlipidemia: Secondary | ICD-10-CM | POA: Diagnosis not present

## 2017-11-14 DIAGNOSIS — N390 Urinary tract infection, site not specified: Secondary | ICD-10-CM | POA: Diagnosis not present

## 2017-11-21 DIAGNOSIS — Z1389 Encounter for screening for other disorder: Secondary | ICD-10-CM | POA: Diagnosis not present

## 2017-11-21 DIAGNOSIS — G4709 Other insomnia: Secondary | ICD-10-CM | POA: Diagnosis not present

## 2017-11-21 DIAGNOSIS — Z6827 Body mass index (BMI) 27.0-27.9, adult: Secondary | ICD-10-CM | POA: Diagnosis not present

## 2017-11-21 DIAGNOSIS — M810 Age-related osteoporosis without current pathological fracture: Secondary | ICD-10-CM | POA: Diagnosis not present

## 2017-11-21 DIAGNOSIS — Z0000 Encounter for general adult medical examination without abnormal findings: Secondary | ICD-10-CM | POA: Diagnosis not present

## 2017-11-21 DIAGNOSIS — R2681 Unsteadiness on feet: Secondary | ICD-10-CM | POA: Diagnosis not present

## 2017-11-21 DIAGNOSIS — E782 Mixed hyperlipidemia: Secondary | ICD-10-CM | POA: Diagnosis not present

## 2017-11-21 DIAGNOSIS — Z23 Encounter for immunization: Secondary | ICD-10-CM | POA: Diagnosis not present

## 2017-11-21 DIAGNOSIS — F418 Other specified anxiety disorders: Secondary | ICD-10-CM | POA: Diagnosis not present

## 2017-11-21 DIAGNOSIS — M545 Low back pain: Secondary | ICD-10-CM | POA: Diagnosis not present

## 2017-11-21 DIAGNOSIS — I10 Essential (primary) hypertension: Secondary | ICD-10-CM | POA: Diagnosis not present

## 2017-11-21 DIAGNOSIS — M79673 Pain in unspecified foot: Secondary | ICD-10-CM | POA: Diagnosis not present

## 2017-11-29 DIAGNOSIS — Z1212 Encounter for screening for malignant neoplasm of rectum: Secondary | ICD-10-CM | POA: Diagnosis not present

## 2018-03-26 DIAGNOSIS — M7742 Metatarsalgia, left foot: Secondary | ICD-10-CM | POA: Diagnosis not present

## 2018-03-26 DIAGNOSIS — M79671 Pain in right foot: Secondary | ICD-10-CM | POA: Diagnosis not present

## 2018-03-26 DIAGNOSIS — M21612 Bunion of left foot: Secondary | ICD-10-CM | POA: Diagnosis not present

## 2018-03-26 DIAGNOSIS — M79672 Pain in left foot: Secondary | ICD-10-CM | POA: Diagnosis not present

## 2018-03-26 DIAGNOSIS — M2042 Other hammer toe(s) (acquired), left foot: Secondary | ICD-10-CM | POA: Diagnosis not present

## 2018-09-18 DIAGNOSIS — T84216A Breakdown (mechanical) of internal fixation device of vertebrae, initial encounter: Secondary | ICD-10-CM | POA: Diagnosis not present

## 2018-09-18 DIAGNOSIS — M5134 Other intervertebral disc degeneration, thoracic region: Secondary | ICD-10-CM | POA: Diagnosis not present

## 2018-09-18 DIAGNOSIS — M4320 Fusion of spine, site unspecified: Secondary | ICD-10-CM | POA: Diagnosis not present

## 2018-09-18 DIAGNOSIS — M4325 Fusion of spine, thoracolumbar region: Secondary | ICD-10-CM | POA: Diagnosis not present

## 2018-09-18 DIAGNOSIS — Z981 Arthrodesis status: Secondary | ICD-10-CM | POA: Diagnosis not present

## 2018-10-08 DIAGNOSIS — Z01419 Encounter for gynecological examination (general) (routine) without abnormal findings: Secondary | ICD-10-CM | POA: Diagnosis not present

## 2018-10-08 DIAGNOSIS — N952 Postmenopausal atrophic vaginitis: Secondary | ICD-10-CM | POA: Diagnosis not present

## 2018-10-08 DIAGNOSIS — Z1231 Encounter for screening mammogram for malignant neoplasm of breast: Secondary | ICD-10-CM | POA: Diagnosis not present

## 2018-10-08 DIAGNOSIS — Z124 Encounter for screening for malignant neoplasm of cervix: Secondary | ICD-10-CM | POA: Diagnosis not present

## 2018-11-17 DIAGNOSIS — E782 Mixed hyperlipidemia: Secondary | ICD-10-CM | POA: Diagnosis not present

## 2018-11-17 DIAGNOSIS — Z23 Encounter for immunization: Secondary | ICD-10-CM | POA: Diagnosis not present

## 2018-11-17 DIAGNOSIS — M810 Age-related osteoporosis without current pathological fracture: Secondary | ICD-10-CM | POA: Diagnosis not present

## 2018-11-24 DIAGNOSIS — Z0000 Encounter for general adult medical examination without abnormal findings: Secondary | ICD-10-CM | POA: Diagnosis not present

## 2018-11-24 DIAGNOSIS — E782 Mixed hyperlipidemia: Secondary | ICD-10-CM | POA: Diagnosis not present

## 2018-11-24 DIAGNOSIS — G4700 Insomnia, unspecified: Secondary | ICD-10-CM | POA: Diagnosis not present

## 2018-11-24 DIAGNOSIS — F418 Other specified anxiety disorders: Secondary | ICD-10-CM | POA: Diagnosis not present

## 2018-11-24 DIAGNOSIS — I10 Essential (primary) hypertension: Secondary | ICD-10-CM | POA: Diagnosis not present

## 2018-11-24 DIAGNOSIS — M545 Low back pain: Secondary | ICD-10-CM | POA: Diagnosis not present

## 2018-11-24 DIAGNOSIS — M810 Age-related osteoporosis without current pathological fracture: Secondary | ICD-10-CM | POA: Diagnosis not present

## 2018-11-24 DIAGNOSIS — Z1339 Encounter for screening examination for other mental health and behavioral disorders: Secondary | ICD-10-CM | POA: Diagnosis not present

## 2018-11-24 DIAGNOSIS — Z1331 Encounter for screening for depression: Secondary | ICD-10-CM | POA: Diagnosis not present

## 2018-11-24 DIAGNOSIS — R2681 Unsteadiness on feet: Secondary | ICD-10-CM | POA: Diagnosis not present

## 2019-03-19 ENCOUNTER — Ambulatory Visit: Payer: Medicare Other

## 2019-03-24 ENCOUNTER — Ambulatory Visit: Payer: Medicare Other

## 2019-03-26 DIAGNOSIS — I10 Essential (primary) hypertension: Secondary | ICD-10-CM | POA: Diagnosis not present

## 2019-03-26 DIAGNOSIS — Z1331 Encounter for screening for depression: Secondary | ICD-10-CM | POA: Diagnosis not present

## 2019-03-26 DIAGNOSIS — M810 Age-related osteoporosis without current pathological fracture: Secondary | ICD-10-CM | POA: Diagnosis not present

## 2019-03-26 DIAGNOSIS — G4700 Insomnia, unspecified: Secondary | ICD-10-CM | POA: Diagnosis not present

## 2019-03-26 DIAGNOSIS — M545 Low back pain: Secondary | ICD-10-CM | POA: Diagnosis not present

## 2019-03-26 DIAGNOSIS — R2681 Unsteadiness on feet: Secondary | ICD-10-CM | POA: Diagnosis not present

## 2019-03-27 ENCOUNTER — Ambulatory Visit: Payer: Medicare Other | Attending: Internal Medicine

## 2019-03-27 DIAGNOSIS — Z23 Encounter for immunization: Secondary | ICD-10-CM

## 2019-03-27 NOTE — Progress Notes (Signed)
   Covid-19 Vaccination Clinic  Name:  Deborah Sellers    MRN: [id number] DOB: 18-May-1945  03/27/2019  [name] was observed post Covid-19 immunization for 15 minutes without incidence. She was provided with Vaccine Information Sheet and instruction to access the V-Safe system.   [name] was instructed to call 911 with any severe reactions post vaccine: Marland Kitchen Difficulty breathing  . Swelling of your face and throat  . A fast heartbeat  . A bad rash all over your body  . Dizziness and weakness    Immunizations Administered    Name Date Dose VIS Date Route   Pfizer COVID-19 Vaccine 03/27/2019  2:27 PM 0.3 mL 01/30/2019 Intramuscular   Manufacturer: [employer], [employer]   Lot: [id number]   NDC: [address]-7

## 2019-04-20 ENCOUNTER — Ambulatory Visit: Payer: Medicare Other | Attending: Internal Medicine

## 2019-04-20 DIAGNOSIS — Z23 Encounter for immunization: Secondary | ICD-10-CM

## 2019-04-20 NOTE — Progress Notes (Signed)
   Covid-19 Vaccination Clinic  Name:  Deborah Sellers    MRN: [id number] DOB: 1945/06/27  04/20/2019  [name] was observed post Covid-19 immunization for 15 minutes without incidence. She was provided with Vaccine Information Sheet and instruction to access the V-Safe system.   [name] was instructed to call 911 with any severe reactions post vaccine: Marland Kitchen Difficulty breathing  . Swelling of your face and throat  . A fast heartbeat  . A bad rash all over your body  . Dizziness and weakness    Immunizations Administered    Name Date Dose VIS Date Route   Pfizer COVID-19 Vaccine 04/20/2019  3:39 PM 0.3 mL 01/30/2019 Intramuscular   Manufacturer: [employer], [employer]   Lot: [id number]   NDC: [address]-3

## 2019-06-24 DIAGNOSIS — H903 Sensorineural hearing loss, bilateral: Secondary | ICD-10-CM | POA: Diagnosis not present

## 2019-09-15 DIAGNOSIS — M545 Low back pain: Secondary | ICD-10-CM | POA: Diagnosis not present

## 2019-09-15 DIAGNOSIS — M542 Cervicalgia: Secondary | ICD-10-CM | POA: Diagnosis not present

## 2019-09-15 DIAGNOSIS — R2681 Unsteadiness on feet: Secondary | ICD-10-CM | POA: Diagnosis not present

## 2019-09-15 DIAGNOSIS — I10 Essential (primary) hypertension: Secondary | ICD-10-CM | POA: Diagnosis not present

## 2019-09-16 DIAGNOSIS — M545 Low back pain: Secondary | ICD-10-CM | POA: Diagnosis not present

## 2019-09-16 DIAGNOSIS — M4322 Fusion of spine, cervical region: Secondary | ICD-10-CM | POA: Diagnosis not present

## 2019-09-16 DIAGNOSIS — M542 Cervicalgia: Secondary | ICD-10-CM | POA: Diagnosis not present

## 2019-09-16 DIAGNOSIS — M4324 Fusion of spine, thoracic region: Secondary | ICD-10-CM | POA: Diagnosis not present

## 2019-09-22 DIAGNOSIS — M545 Low back pain: Secondary | ICD-10-CM | POA: Diagnosis not present

## 2019-09-22 DIAGNOSIS — M4324 Fusion of spine, thoracic region: Secondary | ICD-10-CM | POA: Diagnosis not present

## 2019-09-22 DIAGNOSIS — M4322 Fusion of spine, cervical region: Secondary | ICD-10-CM | POA: Diagnosis not present

## 2019-09-22 DIAGNOSIS — M542 Cervicalgia: Secondary | ICD-10-CM | POA: Diagnosis not present

## 2019-09-29 DIAGNOSIS — M542 Cervicalgia: Secondary | ICD-10-CM | POA: Diagnosis not present

## 2019-09-29 DIAGNOSIS — M4322 Fusion of spine, cervical region: Secondary | ICD-10-CM | POA: Diagnosis not present

## 2019-09-29 DIAGNOSIS — M545 Low back pain: Secondary | ICD-10-CM | POA: Diagnosis not present

## 2019-09-29 DIAGNOSIS — M4324 Fusion of spine, thoracic region: Secondary | ICD-10-CM | POA: Diagnosis not present

## 2019-10-01 DIAGNOSIS — M4324 Fusion of spine, thoracic region: Secondary | ICD-10-CM | POA: Diagnosis not present

## 2019-10-01 DIAGNOSIS — M542 Cervicalgia: Secondary | ICD-10-CM | POA: Diagnosis not present

## 2019-10-01 DIAGNOSIS — M545 Low back pain: Secondary | ICD-10-CM | POA: Diagnosis not present

## 2019-10-01 DIAGNOSIS — M4322 Fusion of spine, cervical region: Secondary | ICD-10-CM | POA: Diagnosis not present

## 2019-10-05 DIAGNOSIS — M542 Cervicalgia: Secondary | ICD-10-CM | POA: Diagnosis not present

## 2019-10-05 DIAGNOSIS — M4324 Fusion of spine, thoracic region: Secondary | ICD-10-CM | POA: Diagnosis not present

## 2019-10-05 DIAGNOSIS — M4322 Fusion of spine, cervical region: Secondary | ICD-10-CM | POA: Diagnosis not present

## 2019-10-05 DIAGNOSIS — M545 Low back pain: Secondary | ICD-10-CM | POA: Diagnosis not present

## 2019-10-07 DIAGNOSIS — M542 Cervicalgia: Secondary | ICD-10-CM | POA: Diagnosis not present

## 2019-10-07 DIAGNOSIS — M4324 Fusion of spine, thoracic region: Secondary | ICD-10-CM | POA: Diagnosis not present

## 2019-10-07 DIAGNOSIS — M545 Low back pain: Secondary | ICD-10-CM | POA: Diagnosis not present

## 2019-10-07 DIAGNOSIS — M4322 Fusion of spine, cervical region: Secondary | ICD-10-CM | POA: Diagnosis not present

## 2019-10-13 DIAGNOSIS — M4324 Fusion of spine, thoracic region: Secondary | ICD-10-CM | POA: Diagnosis not present

## 2019-10-13 DIAGNOSIS — M545 Low back pain: Secondary | ICD-10-CM | POA: Diagnosis not present

## 2019-10-13 DIAGNOSIS — M542 Cervicalgia: Secondary | ICD-10-CM | POA: Diagnosis not present

## 2019-10-13 DIAGNOSIS — M4322 Fusion of spine, cervical region: Secondary | ICD-10-CM | POA: Diagnosis not present

## 2019-10-15 DIAGNOSIS — M4322 Fusion of spine, cervical region: Secondary | ICD-10-CM | POA: Diagnosis not present

## 2019-10-15 DIAGNOSIS — M545 Low back pain: Secondary | ICD-10-CM | POA: Diagnosis not present

## 2019-10-15 DIAGNOSIS — M4324 Fusion of spine, thoracic region: Secondary | ICD-10-CM | POA: Diagnosis not present

## 2019-10-15 DIAGNOSIS — M542 Cervicalgia: Secondary | ICD-10-CM | POA: Diagnosis not present

## 2019-10-20 DIAGNOSIS — M545 Low back pain: Secondary | ICD-10-CM | POA: Diagnosis not present

## 2019-10-20 DIAGNOSIS — M542 Cervicalgia: Secondary | ICD-10-CM | POA: Diagnosis not present

## 2019-10-20 DIAGNOSIS — M4324 Fusion of spine, thoracic region: Secondary | ICD-10-CM | POA: Diagnosis not present

## 2019-10-20 DIAGNOSIS — M4322 Fusion of spine, cervical region: Secondary | ICD-10-CM | POA: Diagnosis not present

## 2019-10-21 ENCOUNTER — Other Ambulatory Visit: Payer: Self-pay

## 2019-10-21 ENCOUNTER — Other Ambulatory Visit: Payer: Medicare Other

## 2019-10-21 DIAGNOSIS — Z20822 Contact with and (suspected) exposure to covid-19: Secondary | ICD-10-CM

## 2019-10-22 LAB — NOVEL CORONAVIRUS, NAA: SARS-CoV-2, NAA: NOT DETECTED

## 2019-10-27 DIAGNOSIS — M542 Cervicalgia: Secondary | ICD-10-CM | POA: Diagnosis not present

## 2019-10-27 DIAGNOSIS — M4324 Fusion of spine, thoracic region: Secondary | ICD-10-CM | POA: Diagnosis not present

## 2019-10-27 DIAGNOSIS — M4322 Fusion of spine, cervical region: Secondary | ICD-10-CM | POA: Diagnosis not present

## 2019-10-27 DIAGNOSIS — M545 Low back pain: Secondary | ICD-10-CM | POA: Diagnosis not present

## 2019-10-29 DIAGNOSIS — Z01419 Encounter for gynecological examination (general) (routine) without abnormal findings: Secondary | ICD-10-CM | POA: Diagnosis not present

## 2019-10-29 DIAGNOSIS — Z1231 Encounter for screening mammogram for malignant neoplasm of breast: Secondary | ICD-10-CM | POA: Diagnosis not present

## 2019-11-03 DIAGNOSIS — M545 Low back pain: Secondary | ICD-10-CM | POA: Diagnosis not present

## 2019-11-03 DIAGNOSIS — M542 Cervicalgia: Secondary | ICD-10-CM | POA: Diagnosis not present

## 2019-11-03 DIAGNOSIS — M4324 Fusion of spine, thoracic region: Secondary | ICD-10-CM | POA: Diagnosis not present

## 2019-11-03 DIAGNOSIS — M4322 Fusion of spine, cervical region: Secondary | ICD-10-CM | POA: Diagnosis not present

## 2019-11-19 DIAGNOSIS — M545 Low back pain: Secondary | ICD-10-CM | POA: Diagnosis not present

## 2019-11-19 DIAGNOSIS — M4322 Fusion of spine, cervical region: Secondary | ICD-10-CM | POA: Diagnosis not present

## 2019-11-19 DIAGNOSIS — M4324 Fusion of spine, thoracic region: Secondary | ICD-10-CM | POA: Diagnosis not present

## 2019-11-19 DIAGNOSIS — M542 Cervicalgia: Secondary | ICD-10-CM | POA: Diagnosis not present

## 2019-11-24 DIAGNOSIS — M810 Age-related osteoporosis without current pathological fracture: Secondary | ICD-10-CM | POA: Diagnosis not present

## 2019-11-24 DIAGNOSIS — I10 Essential (primary) hypertension: Secondary | ICD-10-CM | POA: Diagnosis not present

## 2019-11-24 DIAGNOSIS — E782 Mixed hyperlipidemia: Secondary | ICD-10-CM | POA: Diagnosis not present

## 2019-11-26 DIAGNOSIS — M8589 Other specified disorders of bone density and structure, multiple sites: Secondary | ICD-10-CM | POA: Diagnosis not present

## 2019-11-26 DIAGNOSIS — Z23 Encounter for immunization: Secondary | ICD-10-CM | POA: Diagnosis not present

## 2019-12-01 DIAGNOSIS — M542 Cervicalgia: Secondary | ICD-10-CM | POA: Diagnosis not present

## 2019-12-01 DIAGNOSIS — M549 Dorsalgia, unspecified: Secondary | ICD-10-CM | POA: Diagnosis not present

## 2019-12-01 DIAGNOSIS — M810 Age-related osteoporosis without current pathological fracture: Secondary | ICD-10-CM | POA: Diagnosis not present

## 2019-12-01 DIAGNOSIS — G8929 Other chronic pain: Secondary | ICD-10-CM | POA: Diagnosis not present

## 2019-12-01 DIAGNOSIS — Z0000 Encounter for general adult medical examination without abnormal findings: Secondary | ICD-10-CM | POA: Diagnosis not present

## 2019-12-01 DIAGNOSIS — E782 Mixed hyperlipidemia: Secondary | ICD-10-CM | POA: Diagnosis not present

## 2019-12-01 DIAGNOSIS — G4700 Insomnia, unspecified: Secondary | ICD-10-CM | POA: Diagnosis not present

## 2019-12-01 DIAGNOSIS — R2681 Unsteadiness on feet: Secondary | ICD-10-CM | POA: Diagnosis not present

## 2019-12-01 DIAGNOSIS — I10 Essential (primary) hypertension: Secondary | ICD-10-CM | POA: Diagnosis not present

## 2019-12-09 DIAGNOSIS — M542 Cervicalgia: Secondary | ICD-10-CM | POA: Diagnosis not present

## 2019-12-09 DIAGNOSIS — M4322 Fusion of spine, cervical region: Secondary | ICD-10-CM | POA: Diagnosis not present

## 2019-12-09 DIAGNOSIS — M4326 Fusion of spine, lumbar region: Secondary | ICD-10-CM | POA: Diagnosis not present

## 2019-12-09 DIAGNOSIS — M4324 Fusion of spine, thoracic region: Secondary | ICD-10-CM | POA: Diagnosis not present

## 2019-12-15 DIAGNOSIS — M4322 Fusion of spine, cervical region: Secondary | ICD-10-CM | POA: Diagnosis not present

## 2019-12-15 DIAGNOSIS — M542 Cervicalgia: Secondary | ICD-10-CM | POA: Diagnosis not present

## 2019-12-15 DIAGNOSIS — M4326 Fusion of spine, lumbar region: Secondary | ICD-10-CM | POA: Diagnosis not present

## 2019-12-15 DIAGNOSIS — M4324 Fusion of spine, thoracic region: Secondary | ICD-10-CM | POA: Diagnosis not present

## 2019-12-22 DIAGNOSIS — M542 Cervicalgia: Secondary | ICD-10-CM | POA: Diagnosis not present

## 2019-12-22 DIAGNOSIS — M4322 Fusion of spine, cervical region: Secondary | ICD-10-CM | POA: Diagnosis not present

## 2019-12-22 DIAGNOSIS — M4326 Fusion of spine, lumbar region: Secondary | ICD-10-CM | POA: Diagnosis not present

## 2019-12-22 DIAGNOSIS — M4324 Fusion of spine, thoracic region: Secondary | ICD-10-CM | POA: Diagnosis not present

## 2020-01-05 DIAGNOSIS — M542 Cervicalgia: Secondary | ICD-10-CM | POA: Diagnosis not present

## 2020-01-05 DIAGNOSIS — M4322 Fusion of spine, cervical region: Secondary | ICD-10-CM | POA: Diagnosis not present

## 2020-01-05 DIAGNOSIS — M4326 Fusion of spine, lumbar region: Secondary | ICD-10-CM | POA: Diagnosis not present

## 2020-01-05 DIAGNOSIS — M4324 Fusion of spine, thoracic region: Secondary | ICD-10-CM | POA: Diagnosis not present

## 2020-01-26 DIAGNOSIS — M4322 Fusion of spine, cervical region: Secondary | ICD-10-CM | POA: Diagnosis not present

## 2020-01-26 DIAGNOSIS — M4326 Fusion of spine, lumbar region: Secondary | ICD-10-CM | POA: Diagnosis not present

## 2020-01-26 DIAGNOSIS — M542 Cervicalgia: Secondary | ICD-10-CM | POA: Diagnosis not present

## 2020-01-26 DIAGNOSIS — M4324 Fusion of spine, thoracic region: Secondary | ICD-10-CM | POA: Diagnosis not present

## 2020-04-18 ENCOUNTER — Telehealth: Payer: Self-pay | Admitting: Physical Medicine and Rehabilitation

## 2020-04-18 NOTE — Telephone Encounter (Signed)
Pt called stating she would like to get an appt for shoulder pain  [phone]

## 2020-04-19 NOTE — Telephone Encounter (Signed)
Scheduled for OV with Dr. Prince Rome for right shoulder pain.

## 2020-04-22 ENCOUNTER — Other Ambulatory Visit: Payer: Self-pay

## 2020-04-22 ENCOUNTER — Encounter: Payer: Self-pay | Admitting: Family Medicine

## 2020-04-22 ENCOUNTER — Ambulatory Visit (INDEPENDENT_AMBULATORY_CARE_PROVIDER_SITE_OTHER): Payer: Medicare Other | Admitting: Family Medicine

## 2020-04-22 DIAGNOSIS — M25511 Pain in right shoulder: Secondary | ICD-10-CM

## 2020-04-22 NOTE — Progress Notes (Signed)
Office Visit Note   Patient: Deborah Sellers           Date of Birth: [birth year]           MRN: [id number] Visit Date: 04/22/2020 Requested by: Jarome Matin, MD [address] [place],  [place] [address] PCP: Jarome Matin, MD  Subjective: Chief Complaint  Patient presents with  . Right Shoulder - Pain    Has had pain in the right side of her neck and into the shoulder for a while, but it has become more centralized in the shoulder for the last 2 months. Has had dry needling at PT, but it has not helped. Cannot lie on the right side. Uses cane in the right hand. Pulls herself up steps with the right arm on the railing. ROM is still good in the arm.    HPI: She is here with right shoulder pain.  Symptoms started about 2 months ago, no injury.  She has had fusion of her entire spine due to scoliosis.  As a result, she has to walk with a cane.  She has been seeing Dr. Einar Pheasant for the past couple months for physical therapy and has gotten some temporary improvement.  At first she thought her pain was coming from the neck, but now it seems to have localized to the shoulder.  Denies any numbness or tingling in her hand.  Pain keeps her awake at night.  She takes Dilaudid sometimes in the morning to manage her symptoms.                ROS:   All other systems were reviewed and are negative.  Objective: Vital Signs: There were no vitals taken for this visit.  Physical Exam:  General:  Alert and oriented, in no acute distress. Pulm:  Breathing unlabored. Psy:  Normal mood, congruent affect.  Right shoulder: Full active range of motion, full passive range of motion.  She has palpable crepitus in the subacromial area with passive movement.  Rotator cuff strength is 5/5, she has 5/5 deltoid, biceps, triceps, wrist and intrinsic hand strength.   Imaging: No results found.  Assessment & Plan: 1.  Right shoulder pain, suspect impingement -Discussed options and elected to inject the  subacromial space.  She will contact me next week if symptoms have not improved, and we will order x-rays followed by possible MRI scan.     Procedures: Right shoulder subacromial injection: After sterile prep with Betadine, injected 4 cc 0.25% bupivacaine and 6 mg betamethasone.  She had excellent relief during the anesthetic phase.       PMFS History: Patient Active Problem List   Diagnosis Date Noted  . Diverticulosis of entire colon 08/04/2015  . Hypoxia 08/04/2015  . Tachycardia 08/04/2015  . SBO (small bowel obstruction) (HCC) 08/04/2015  . Chronic constipation 08/04/2015  . Diversion colitis 08/04/2015  . Chronic back pain 08/04/2015  . Essential (primary) hypertension 10/05/2014  . H/O anxiety state 10/05/2014  . Idiopathic scoliosis and kyphoscoliosis 05/13/2014  . Complication of surgical procedure 05/13/2014   Past Medical History:  Diagnosis Date  . Anxiety   . History of blood transfusion    related to surgery with own blood replacement  . Hyperlipidemia   . Hypertension   . Kyphoscoliosis   . Post laminectomy syndrome     Family History  Problem Relation Age of Onset  . Colon cancer Neg Hx   . Esophageal cancer Neg Hx   . Rectal cancer Neg Hx   .  Stomach cancer Neg Hx     Past Surgical History:  Procedure Laterality Date  . BACK SURGERY  2007   Dr Jordan Likes, T12-L5  . COLONOSCOPY    . DIAPHRAGMATIC HERNIA REPAIR  2007   Laparoscopic repair of diaphragmatic hernia with PROCEED  . INGUINAL HERNIA REPAIR Left 08/05/2015   Procedure: HERNIA REPAIR INGUINAL ADULT;  Surgeon: Ovidio Kin, MD;  Location: [facility];  Service: General;  Laterality: Left;  . INSERTION OF MESH Left 08/05/2015   Procedure: INSERTION OF MESH;  Surgeon: Ovidio Kin, MD;  Location: [facility];  Service: General;  Laterality: Left;  . POSTERIOR FUSION SPINAL DEFORMITY  9.6.2016   Social History   Occupational History  . Not on file  Tobacco Use  . Smoking status: Never Smoker  .  Smokeless tobacco: Never Used  Substance and Sexual Activity  . Alcohol use: Yes    Alcohol/week: 3.0 - 4.0 standard drinks    Types: 3 - 4 Glasses of wine per week  . Drug use: No  . Sexual activity: Not on file

## 2020-06-03 DIAGNOSIS — Z23 Encounter for immunization: Secondary | ICD-10-CM | POA: Diagnosis not present

## 2023-01-10 ENCOUNTER — Encounter: Payer: Self-pay | Admitting: Internal Medicine
